# Patient Record
Sex: Female | Born: 1975
Health system: Southern US, Community
[De-identification: ages and names within clinical notes are randomized; demographics above are authoritative.]

## PROBLEM LIST (undated history)

## (undated) DIAGNOSIS — I219 Acute myocardial infarction, unspecified: Secondary | ICD-10-CM

## (undated) DIAGNOSIS — G43909 Migraine, unspecified, not intractable, without status migrainosus: Secondary | ICD-10-CM

## (undated) HISTORY — PX: NO PAST SURGERIES: SHX2092

## (undated) HISTORY — DX: Migraine, unspecified, not intractable, without status migrainosus: G43.909

---

## 2007-11-06 ENCOUNTER — Inpatient Hospital Stay (HOSPITAL_COMMUNITY): Admission: EM | Admit: 2007-11-06 | Discharge: 2007-11-09 | Payer: Self-pay | Admitting: Psychiatry

## 2007-11-06 ENCOUNTER — Ambulatory Visit: Payer: Self-pay | Admitting: Psychiatry

## 2007-11-21 ENCOUNTER — Inpatient Hospital Stay (HOSPITAL_COMMUNITY): Admission: AD | Admit: 2007-11-21 | Discharge: 2007-11-24 | Payer: Self-pay | Admitting: *Deleted

## 2007-11-21 ENCOUNTER — Ambulatory Visit: Payer: Self-pay | Admitting: *Deleted

## 2009-07-20 ENCOUNTER — Emergency Department (HOSPITAL_COMMUNITY): Admission: EM | Admit: 2009-07-20 | Discharge: 2009-07-20 | Payer: Self-pay | Admitting: Emergency Medicine

## 2009-07-21 ENCOUNTER — Ambulatory Visit (HOSPITAL_COMMUNITY): Admission: RE | Admit: 2009-07-21 | Discharge: 2009-07-21 | Payer: Self-pay | Admitting: Emergency Medicine

## 2010-10-07 IMAGING — US US PELVIS COMPLETE MODIFY
1 series · 14 of 25 positions shown · non-contrast
Comparison: None

CLINICAL DATA: Abdominal pain question ovarian cysts

TRANSABDOMINAL AND TRANSVAGINAL ULTRASOUND OF PELVIS
TECHNIQUE: Both transabdominal and transvaginal ultrasound
examinations of the pelvis were performed including evaluation of
the uterus, ovaries, adnexal regions, and pelvic cul-de-sac.

[Series 1: us pelvis complete modify · 0.24mm/px · 14 of 77 slices shown]
[im 1/77]
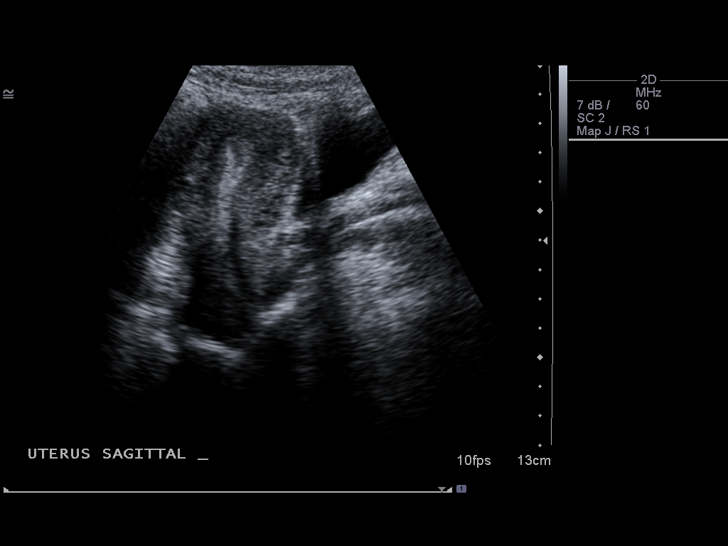
[im 7/77]
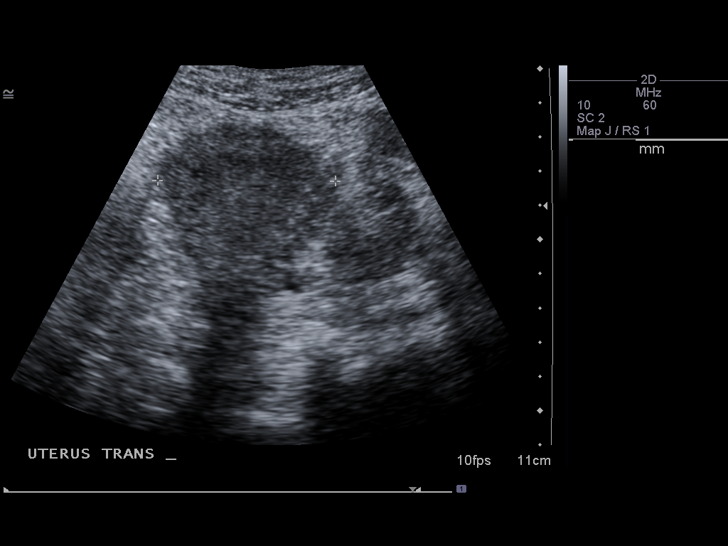
[im 13/77]
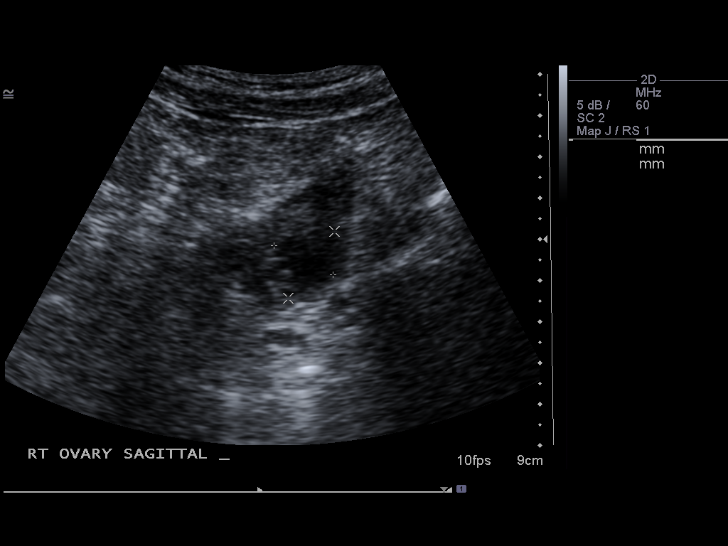
[im 20/77]
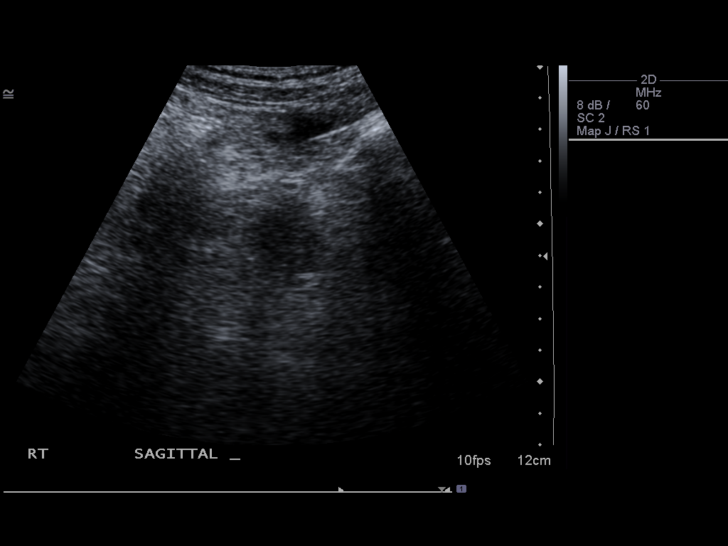
[im 26/77]
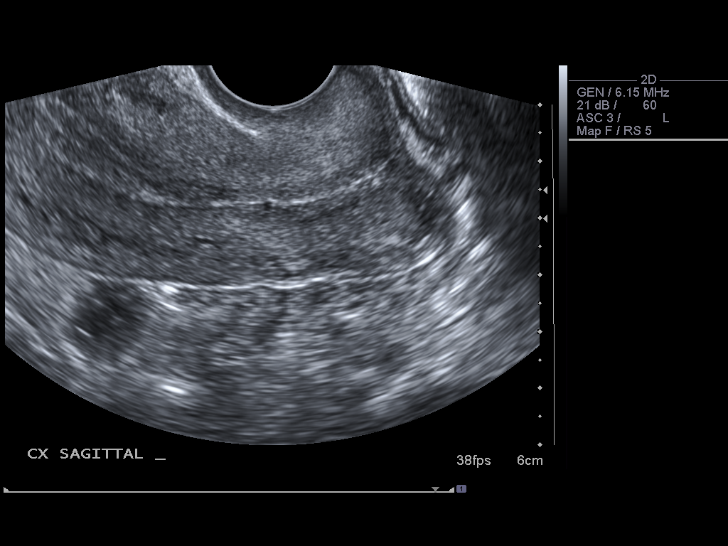
[im 29/77]
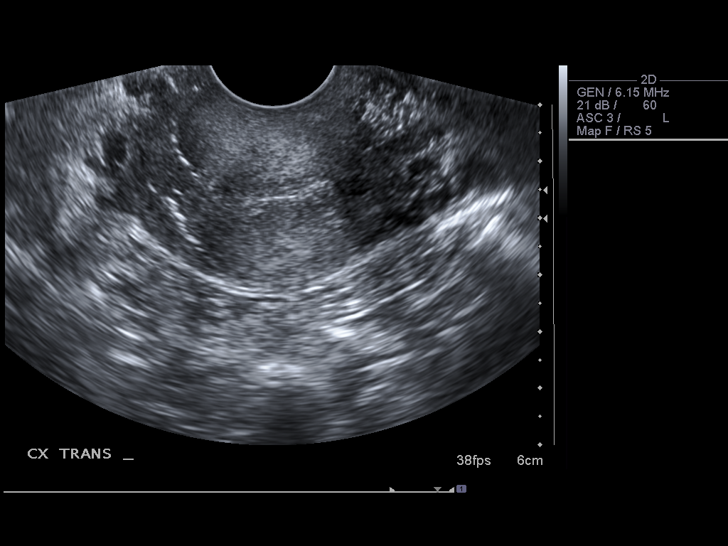
[im 35/77]
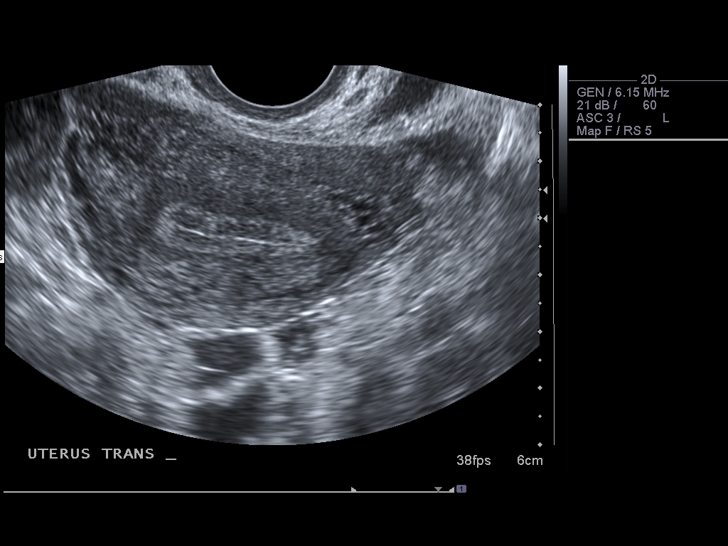
[im 42/77]
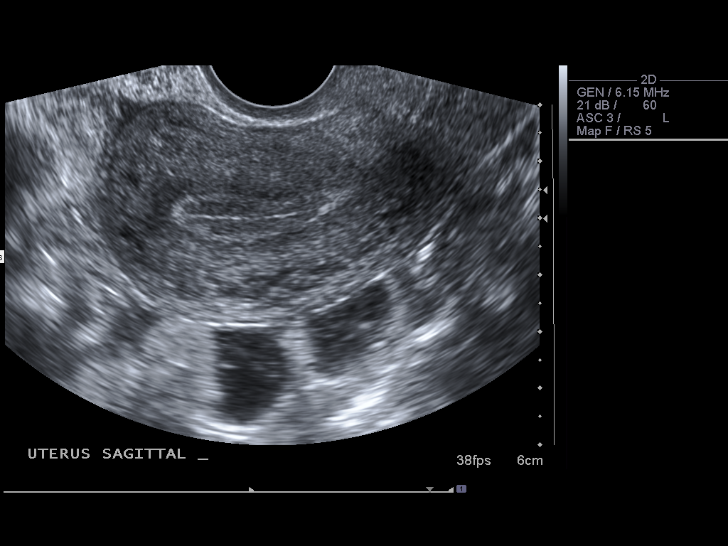
[im 48/77]
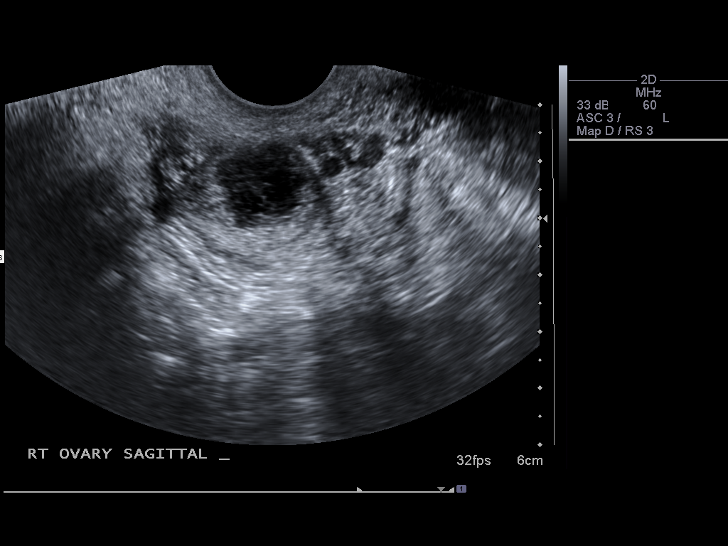
[im 51/77]
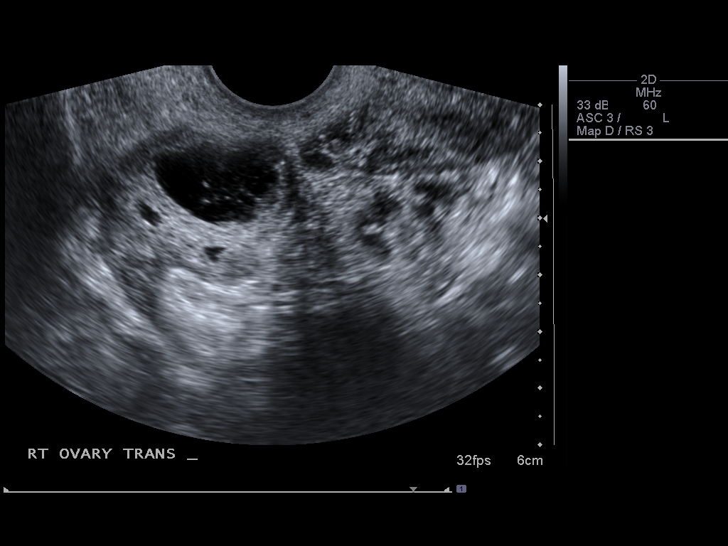
[im 58/77]
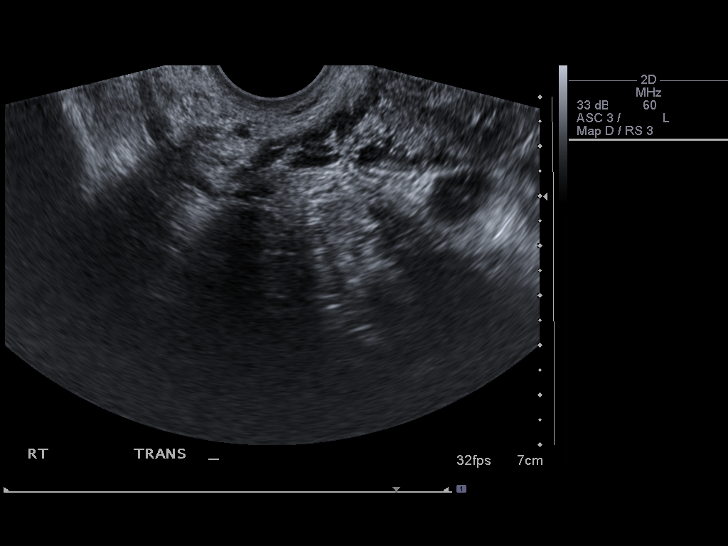
[im 64/77]
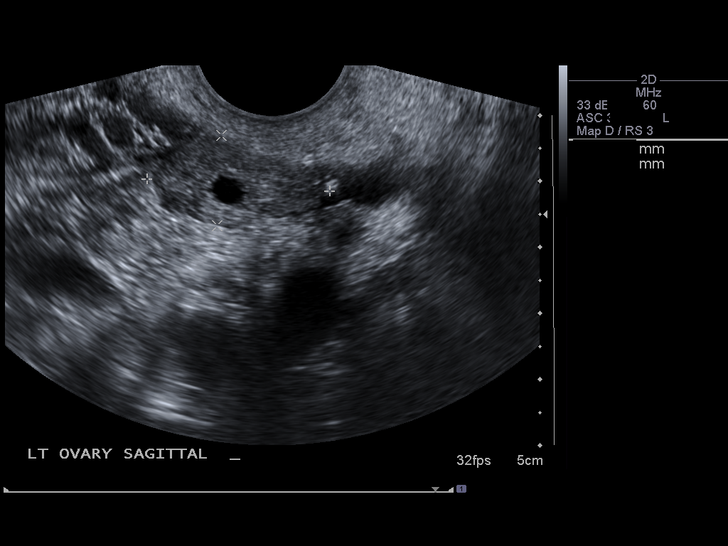
[im 70/77]
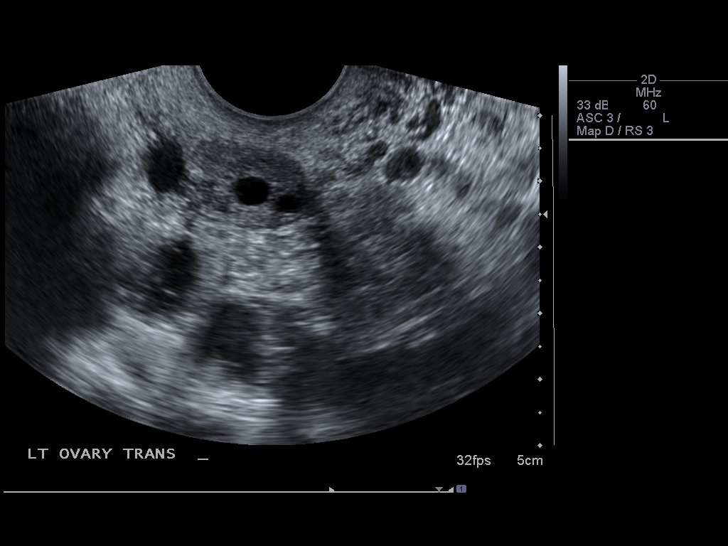
[im 77/77]
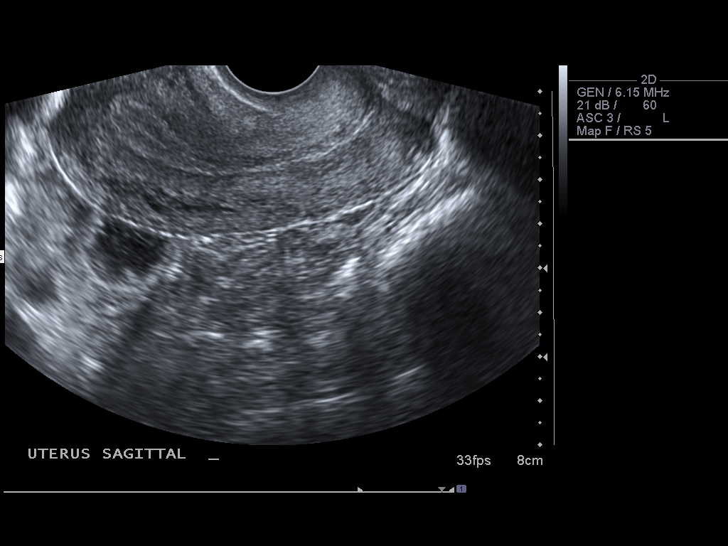

[14 of 25 positions shown; findings below may reference images not displayed]

FINDINGS: Uterus measures 8.7 cm length by 4.1 cm AP by 5.6 cm transverse.
No uterine mass.

Endometrium measures 9 mm in thickness.  No endometrial fluid

Right Ovary measures 3.5 x 3.0 x 3.6 cm.
Complicated cyst, question hemorrhagic, right ovary, 2.0 x 1.9 x
2.5 cm.

Left Ovary measures 2.8 x 1.4 x 2.2 cm.  Normal morphology without
mass.

Other Findings:  No free pelvic fluid or additional adnexal masses.
IMPRESSION: Small complicated question hemorrhagic cyst right ovary 2.5 cm
greatest size, recommend follow-up ultrasound after one to two
menstrual cycles in order to reassess, to exclude cystic ovarian
neoplasm.

## 2011-03-18 LAB — CBC
Hemoglobin: 13.1 g/dL (ref 12.0–15.0)
MCHC: 34.6 g/dL (ref 30.0–36.0)
RBC: 3.93 MIL/uL (ref 3.87–5.11)
WBC: 8.1 10*3/uL (ref 4.0–10.5)

## 2011-03-18 LAB — DIFFERENTIAL
Lymphocytes Relative: 29 % (ref 12–46)
Lymphs Abs: 2.4 10*3/uL (ref 0.7–4.0)
Monocytes Absolute: 0.5 10*3/uL (ref 0.1–1.0)
Monocytes Relative: 6 % (ref 3–12)
Neutro Abs: 5 10*3/uL (ref 1.7–7.7)
Neutrophils Relative %: 62 % (ref 43–77)

## 2011-03-18 LAB — URINALYSIS, ROUTINE W REFLEX MICROSCOPIC
Nitrite: NEGATIVE
Specific Gravity, Urine: >1.046 — ABNORMAL HIGH (ref 1.005–1.030)
Urobilinogen, UA: 1 mg/dL (ref 0.0–1.0)
pH: 5 (ref 5.0–8.0)

## 2011-03-18 LAB — BASIC METABOLIC PANEL
CO2: 27 mEq/L (ref 19–32)
Calcium: 8.6 mg/dL (ref 8.4–10.5)
Creatinine, Ser: 0.85 mg/dL (ref 0.4–1.2)
GFR calc Af Amer: >60 mL/min (ref >60)
GFR calc non Af Amer: >60 mL/min (ref >60)
Sodium: 137 mEq/L (ref 135–145)

## 2011-03-18 LAB — HEPATIC FUNCTION PANEL
ALT: 8 U/L (ref 0–35)
AST: 19 U/L (ref 0–37)
Albumin: 3.7 g/dL (ref 3.5–5.2)
Alkaline Phosphatase: 19 U/L — ABNORMAL LOW (ref 39–117)
Bilirubin, Direct: <0.1 mg/dL (ref 0.0–0.3)
Total Bilirubin: 0.4 mg/dL (ref 0.3–1.2)

## 2011-03-18 LAB — POCT PREGNANCY, URINE: Preg Test, Ur: NEGATIVE
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
## 2011-04-24 NOTE — H&P (Signed)
NAMEMAKALA, FETTEROLF NO.:  [id number]   MEDICAL RECORD NO.:  [id number]          PATIENT TYPE:  IPS   LOCATION:  0605                          FACILITY:  [facility]   PHYSICIAN:  Unknown                DATE OF BIRTH:  1976-06-11   DATE OF ADMISSION:  11/21/2007  DATE OF DISCHARGE:                       PSYCHIATRIC ADMISSION ASSESSMENT   Report totally inaudible and cannot be transcribed.      Vic Ripper, P.A.-C.    ______________________________  Unknown    MD/MEDQ  D:  11/22/2007  T:  11/24/2007  Job:  [id number]

## 2011-04-24 NOTE — Discharge Summary (Signed)
NAMEJAMYIA, FORTUNE NO.:  [id number]   MEDICAL RECORD NO.:  [id number]          PATIENT TYPE:  IPS   LOCATION:  0605                          FACILITY:  [facility]   PHYSICIAN:  Jasmine Pang, M.D. DATE OF BIRTH:  04-10-1976   DATE OF ADMISSION:  11/21/2007  DATE OF DISCHARGE:  11/24/2007                               DISCHARGE SUMMARY   IDENTIFICATION:  The patient is a 35 year old Caucasian female who  overdosed on Seroquel.   HISTORY OF PRESENT ILLNESS:  The patient was at [facility] less than 2  weeks ago.  She has significant stress and depression.  She states that  she believes she is taking too much of Seroquel, which made her pass  out.  She cannot remember taking the overdose.  She denied it was  intentional where the ED doctor felt it was an intentional overdose.  She was lethargic when she got to the ED.  She recently had conflicts  with her fiance and she also is unemployed and just began Mining engineer program.  The patient was at [facility] on November 06, 2007 through November 09, 2007.  She did go to [facility] for followup.  She has a family history of alcohol dependence in  mother and maternal side, have a history of MIs.  She drinks alcohol; a  fifth of vodka on November 19, 2007.  She denies other drug use.  She  has no primary care physician.  She has no medical problems.  There is  no acute or chronic medical problems.  She states she was on Seroquel  200 mg p.o. q.h.s., but does not want to stay on this anymore.  She has  no known drug allergies.   PHYSICAL FINDINGS:  A complete physical exam was done at [facility] while the patient was admitted there after the overdose.   ADMISSION LABORATORIES:  Evaluated by the ED physician.   HOSPITAL COURSE:  Upon admission, the patient was continued on Seroquel  200 mg p.o. q.h.s.  She refused to take this however, and stated she did  not feel the Seroquel had  been helping her.  She stated she did not want  to be on any medication.  She was friendly and appropriate in individual  sessions.  She also was able to participate appropriately in unit  therapeutic groups and activities.  The patient tended to minimize her  problems prior to admission.  Her UDS was positive for benzodiazepine.  She stated her boyfriend called 9-1-1 after she passed out from taking  Seroquel.  She also admitted to drinking.  She stated she was going to  go home and live with her mother for a while.  Again, she refused  medications while here.  On November 24, 2007, mental status had  improved from admission status.  The patient was friendly and  cooperative with good eye contact.  Speech was normal rate and flow.  Psychomotor activity was within normal limits.  Mood was euthymic.  Affect, wide range.  There  was no suicidal or homicidal ideation.  No  thoughts of self-injurious behavior.  No auditory or visual  hallucinations.  No paranoia or delusions.  Thoughts were logical and  goal-directed.  Thought content, no predominant theme.  Cognitive was  grossly back to baseline.  It was felt that the patient was safe to be  discharged today.  She stated she was going to go home to live with her  mother for a while rather than trying to return to her fiance.   DISCHARGE DIAGNOSES:  AXIS I:  Mood disorder, not otherwise specified.  Alcohol dependence.  AXIS II:  Personality disorder, not otherwise specified.  AXIS III:  None.  AXIS IV:  Severe (problems with primary support group, occupational  problem, housing problem, economic problem, and burden of psychiatric  illness.  AXIS V:  Global assessment of functioning upon discharge was 50.  Global  assessment of functioning upon admission was 25.  Global assessment of  functioning highest past year was 60-65.   DISCHARGE PLAN:  There were no specific activity level or dietary  restrictions.   POSTHOSPITAL CARE PLAN:  The  patient will go to [facility] on November 25, 2007, at 9 o'clock a.m.  She is followed by  therapeutic alternatives at the clinic and will continue to be seen by  them.   DISCHARGE MEDICATIONS:  The patient has stated that she did not want  medications at this point.  She did not feel Seroquel had been helpful  for her; in fact felt it had worsened her condition.      Jasmine Pang, M.D.  Electronically Signed     BHS/MEDQ  D:  11/24/2007  T:  11/25/2007  Job:  [id number]

## 2011-11-13 ENCOUNTER — Emergency Department (HOSPITAL_COMMUNITY)
Admission: EM | Admit: 2011-11-13 | Discharge: 2011-11-13 | Disposition: A | Discharge status: Home / Self Care | Payer: Self-pay | Attending: Emergency Medicine | Admitting: Emergency Medicine

## 2011-11-13 DIAGNOSIS — R109 Unspecified abdominal pain: Secondary | ICD-10-CM | POA: Insufficient documentation

## 2011-11-13 DIAGNOSIS — R10816 Epigastric abdominal tenderness: Secondary | ICD-10-CM | POA: Insufficient documentation

## 2011-11-13 DIAGNOSIS — K259 Gastric ulcer, unspecified as acute or chronic, without hemorrhage or perforation: Secondary | ICD-10-CM | POA: Insufficient documentation

## 2011-11-13 HISTORY — DX: Acute myocardial infarction, unspecified: I21.9

## 2011-11-13 LAB — CBC
MCH: 32.9 pg (ref 26.0–34.0)
MCHC: 34.6 g/dL (ref 30.0–36.0)
MCV: 95.1 fL (ref 78.0–100.0)
Platelets: 198 10*3/uL (ref 150–400)
RBC: 4.07 MIL/uL (ref 3.87–5.11)

## 2011-11-13 LAB — COMPREHENSIVE METABOLIC PANEL
AST: 11 U/L (ref 0–37)
Albumin: 3.5 g/dL (ref 3.5–5.2)
BUN: 10 mg/dL (ref 6–23)
CO2: 24 mEq/L (ref 19–32)
Calcium: 8.9 mg/dL (ref 8.4–10.5)
Chloride: 106 mEq/L (ref 96–112)
Creatinine, Ser: 0.67 mg/dL (ref 0.50–1.10)
GFR calc non Af Amer: >90 mL/min (ref >90)
Total Bilirubin: 0.3 mg/dL (ref 0.3–1.2)

## 2011-11-13 LAB — URINALYSIS, ROUTINE W REFLEX MICROSCOPIC
Leukocytes, UA: NEGATIVE
Protein, ur: NEGATIVE mg/dL
Specific Gravity, Urine: 1.01 (ref 1.005–1.030)
Urobilinogen, UA: 0.2 mg/dL (ref 0.0–1.0)

## 2011-11-13 LAB — LIPASE, BLOOD: Lipase: 23 U/L (ref 11–59)

## 2011-11-13 LAB — URINE MICROSCOPIC-ADD ON

## 2011-11-13 LAB — PREGNANCY, URINE: Preg Test, Ur: NEGATIVE

## 2011-11-13 MED ORDER — PANTOPRAZOLE SODIUM 40 MG IV SOLR
40.0000 mg | Freq: Once | INTRAVENOUS | Status: AC
  Administered 2011-11-13: 40 mg via INTRAVENOUS
  Filled 2011-11-13: qty 40

## 2011-11-13 MED ORDER — SODIUM CHLORIDE 0.9 % IV BOLUS (SEPSIS)
1000.0000 mL | Freq: Once | INTRAVENOUS | Status: AC
  Administered 2011-11-13: 1000 mL via INTRAVENOUS

## 2011-11-13 MED ORDER — ONDANSETRON HCL 4 MG PO TABS: 4.0000 mg | ORAL_TABLET | Freq: Four times a day (QID) | ORAL | Status: AC

## 2011-11-13 MED ORDER — GI COCKTAIL ~~LOC~~
30.0000 mL | Freq: Once | ORAL | Status: AC
  Administered 2011-11-13: 30 mL via ORAL
  Filled 2011-11-13: qty 30

## 2011-11-13 MED ORDER — OXYCODONE-ACETAMINOPHEN 5-325 MG PO TABS: 1.0000 | ORAL_TABLET | Freq: Four times a day (QID) | ORAL | Status: AC | PRN

## 2011-11-13 MED ORDER — MORPHINE SULFATE 2 MG/ML IJ SOLN
INTRAMUSCULAR | Status: AC
  Administered 2011-11-13: 4 mg via INTRAVENOUS
  Filled 2011-11-13: qty 2

## 2011-11-13 MED ORDER — OMEPRAZOLE 20 MG PO CPDR: 20.0000 mg | DELAYED_RELEASE_CAPSULE | Freq: Every day | ORAL | Status: DC

## 2011-11-13 MED ORDER — MORPHINE SULFATE 4 MG/ML IJ SOLN: 4.0000 mg | Freq: Once | INTRAMUSCULAR | Status: DC

## 2011-11-13 NOTE — ED Provider Notes (Signed)
History     CSN: [id number] Arrival date & time: 11/13/2011  9:05 AM   First MD Initiated Contact with Patient 11/13/11 1007      Chief Complaint  Patient presents with  . Abdominal Pain  . Nausea  . Emesis    (Consider location/radiation/quality/duration/timing/severity/associated sxs/prior treatment) The history is provided by the patient.    Pt presents to the ED with complaints of midepigastric pain for 3 weeks. She was seen at the [facility]  ED and was told that she has bronchitis and given prednisone and some other medications but she says that it made it worse. She says that they did take blood and do a chest xray. The patient states that the pain gets much worse after eating and has kept her from eating as much as normal. She says that she has lost weight from this. She denies vomiting, diarrhea, chest pains and denies a pervious hx of these symptoms, heart burn, or ulcers in the past  Past Medical History  Diagnosis Date  . Heart attack     11 years ago    History reviewed. No pertinent past surgical history.  No family history on file.  History  Substance Use Topics  . Smoking status: Current Everyday Smoker -- 0.5 packs/day    Types: Cigarettes  . Smokeless tobacco: Not on file  . Alcohol Use: No    OB History    Grav Para Term Preterm Abortions TAB SAB Ect Mult Living                  Review of Systems  All other systems reviewed and are negative.    Allergies  Review of patient's allergies indicates no known allergies.  Home Medications   Current Outpatient Rx  Name Route Sig Dispense Refill  . AZITHROMYCIN 250 MG PO TABS Oral Take 250 mg by mouth daily. zpak completed therapy on 11/12/11     . HYDROCODONE-HOMATROPINE 5-1.5 MG/5ML PO SYRP Oral Take 5 mLs by mouth every 6 (six) hours as needed. cough     . IBUPROFEN 200 MG PO TABS Oral Take 200 mg by mouth every 6 (six) hours as needed. pain     . METHYLPREDNISOLONE 4 MG PO KIT Oral Take 4 mg by  mouth daily. follow package directions-completed therapy on12/3/12       BP 117/71  Pulse 65  Temp(Src) 97.8 F (36.6 C) (Oral)  Resp 18  Ht 5\' 10"  (1.778 m)  Wt 120 lb (54.432 kg)  BMI 17.22 kg/m2  SpO2 100%  LMP 11/12/2011  Physical Exam  Nursing note and vitals reviewed. Constitutional: She appears well-developed and well-nourished.  HENT:  Head: Normocephalic and atraumatic.  Eyes: Conjunctivae are normal. Pupils are equal, round, and reactive to light.  Neck: Trachea normal, normal range of motion and full passive range of motion without pain. Neck supple.  Cardiovascular: Normal rate, regular rhythm and normal pulses.   Pulmonary/Chest: Effort normal and breath sounds normal. Chest wall is not dull to percussion. She exhibits no tenderness, no crepitus, no edema, no deformity and no retraction.  Abdominal: Soft. Normal appearance and bowel sounds are normal. She exhibits no distension and no mass. There is tenderness (mid epigastric pain is mod/severe on plapation). There is no rebound and no guarding.  Musculoskeletal: Normal range of motion.  Neurological: She is alert. She has normal strength.  Skin: Skin is warm, dry and intact.  Psychiatric: She has a normal mood and affect. Her speech is  normal and behavior is normal. Judgment and thought content normal. Cognition and memory are normal.    ED Course  Procedures (including critical care time)   Labs Reviewed  CBC  LIPASE, BLOOD  COMPREHENSIVE METABOLIC PANEL  URINALYSIS, ROUTINE W REFLEX MICROSCOPIC  PREGNANCY, URINE   No results found.   No diagnosis found.    MDM  Clinically the patient appears to have a peptic/ gastric ulcer- pain worse with eating, mid epigastric pain reproducible with palpation, slight improvement with GI cocktail, pain has been going on for 3 weeks. Will treat for ulcer with PPI and refer to GI doctor.         Dorthula Matas, PA 11/13/11 1236

## 2011-11-13 NOTE — ED Provider Notes (Signed)
Medical screening examination/treatment/procedure(s) were performed by non-physician practitioner and as supervising physician I was immediately available for consultation/collaboration.    Nelia Shi, MD 11/13/11 [phone]

## 2011-11-13 NOTE — ED Notes (Signed)
Pt presents with NAD- seen at local hospital for abd pain but was dx with  bronchitis. rx  given.  Reason for coming to hospital was for abdominal pain xray taken only. Pt reports weight loss and continued symptoms for 3 weeks

## 2018-02-06 DIAGNOSIS — Z1331 Encounter for screening for depression: Secondary | ICD-10-CM | POA: Diagnosis not present

## 2018-02-06 DIAGNOSIS — G43909 Migraine, unspecified, not intractable, without status migrainosus: Secondary | ICD-10-CM | POA: Diagnosis not present

## 2018-02-06 DIAGNOSIS — Z1389 Encounter for screening for other disorder: Secondary | ICD-10-CM | POA: Diagnosis not present

## 2018-02-06 DIAGNOSIS — R5382 Chronic fatigue, unspecified: Secondary | ICD-10-CM | POA: Diagnosis not present

## 2018-02-06 DIAGNOSIS — K219 Gastro-esophageal reflux disease without esophagitis: Secondary | ICD-10-CM | POA: Diagnosis not present

## 2018-02-06 DIAGNOSIS — Z681 Body mass index (BMI) 19 or less, adult: Secondary | ICD-10-CM | POA: Diagnosis not present

## 2018-02-06 DIAGNOSIS — H8110 Benign paroxysmal vertigo, unspecified ear: Secondary | ICD-10-CM | POA: Diagnosis not present

## 2018-02-17 DIAGNOSIS — R350 Frequency of micturition: Secondary | ICD-10-CM | POA: Diagnosis not present

## 2018-02-17 DIAGNOSIS — N3010 Interstitial cystitis (chronic) without hematuria: Secondary | ICD-10-CM | POA: Diagnosis not present

## 2018-02-19 DIAGNOSIS — N3010 Interstitial cystitis (chronic) without hematuria: Secondary | ICD-10-CM | POA: Diagnosis not present

## 2018-02-19 DIAGNOSIS — Q048 Other specified congenital malformations of brain: Secondary | ICD-10-CM | POA: Diagnosis not present

## 2018-02-19 DIAGNOSIS — K219 Gastro-esophageal reflux disease without esophagitis: Secondary | ICD-10-CM | POA: Diagnosis not present

## 2018-02-19 DIAGNOSIS — G43109 Migraine with aura, not intractable, without status migrainosus: Secondary | ICD-10-CM | POA: Diagnosis not present

## 2018-02-19 DIAGNOSIS — Z681 Body mass index (BMI) 19 or less, adult: Secondary | ICD-10-CM | POA: Diagnosis not present

## 2018-03-04 DIAGNOSIS — N3010 Interstitial cystitis (chronic) without hematuria: Secondary | ICD-10-CM | POA: Diagnosis not present

## 2018-03-04 DIAGNOSIS — R358 Other polyuria: Secondary | ICD-10-CM | POA: Diagnosis not present

## 2018-03-05 DIAGNOSIS — N3010 Interstitial cystitis (chronic) without hematuria: Secondary | ICD-10-CM | POA: Diagnosis not present

## 2018-03-05 DIAGNOSIS — Z87891 Personal history of nicotine dependence: Secondary | ICD-10-CM | POA: Diagnosis not present

## 2018-03-05 DIAGNOSIS — Z681 Body mass index (BMI) 19 or less, adult: Secondary | ICD-10-CM | POA: Diagnosis not present

## 2018-03-05 DIAGNOSIS — K219 Gastro-esophageal reflux disease without esophagitis: Secondary | ICD-10-CM | POA: Diagnosis not present

## 2018-03-05 DIAGNOSIS — Q048 Other specified congenital malformations of brain: Secondary | ICD-10-CM | POA: Diagnosis not present

## 2018-03-05 DIAGNOSIS — F17200 Nicotine dependence, unspecified, uncomplicated: Secondary | ICD-10-CM | POA: Diagnosis not present

## 2018-03-05 DIAGNOSIS — G43909 Migraine, unspecified, not intractable, without status migrainosus: Secondary | ICD-10-CM | POA: Diagnosis not present

## 2018-03-18 DIAGNOSIS — H52209 Unspecified astigmatism, unspecified eye: Secondary | ICD-10-CM | POA: Diagnosis not present

## 2018-03-18 DIAGNOSIS — H52229 Regular astigmatism, unspecified eye: Secondary | ICD-10-CM | POA: Diagnosis not present

## 2018-03-18 DIAGNOSIS — H5213 Myopia, bilateral: Secondary | ICD-10-CM | POA: Diagnosis not present

## 2018-03-20 DIAGNOSIS — H40003 Preglaucoma, unspecified, bilateral: Secondary | ICD-10-CM | POA: Diagnosis not present

## 2018-04-04 DIAGNOSIS — Z681 Body mass index (BMI) 19 or less, adult: Secondary | ICD-10-CM | POA: Diagnosis not present

## 2018-04-04 DIAGNOSIS — R636 Underweight: Secondary | ICD-10-CM | POA: Diagnosis not present

## 2018-04-04 DIAGNOSIS — G43909 Migraine, unspecified, not intractable, without status migrainosus: Secondary | ICD-10-CM | POA: Diagnosis not present

## 2018-04-04 DIAGNOSIS — Q048 Other specified congenital malformations of brain: Secondary | ICD-10-CM | POA: Diagnosis not present

## 2018-04-04 DIAGNOSIS — F17200 Nicotine dependence, unspecified, uncomplicated: Secondary | ICD-10-CM | POA: Diagnosis not present

## 2018-04-04 DIAGNOSIS — K219 Gastro-esophageal reflux disease without esophagitis: Secondary | ICD-10-CM | POA: Diagnosis not present

## 2018-04-04 DIAGNOSIS — N3010 Interstitial cystitis (chronic) without hematuria: Secondary | ICD-10-CM | POA: Diagnosis not present

## 2018-04-10 ENCOUNTER — Encounter: Payer: Self-pay | Admitting: Neurology

## 2018-04-10 ENCOUNTER — Ambulatory Visit (INDEPENDENT_AMBULATORY_CARE_PROVIDER_SITE_OTHER): Payer: Medicare HMO | Admitting: Neurology

## 2018-04-10 VITALS — BP 108/70 | HR 58 | Ht 71.0 in | Wt 125.2 lb

## 2018-04-10 DIAGNOSIS — R52 Pain, unspecified: Secondary | ICD-10-CM | POA: Diagnosis not present

## 2018-04-10 DIAGNOSIS — R51 Headache: Secondary | ICD-10-CM | POA: Diagnosis not present

## 2018-04-10 DIAGNOSIS — R799 Abnormal finding of blood chemistry, unspecified: Secondary | ICD-10-CM | POA: Diagnosis not present

## 2018-04-10 DIAGNOSIS — G43711 Chronic migraine without aura, intractable, with status migrainosus: Secondary | ICD-10-CM | POA: Diagnosis not present

## 2018-04-10 DIAGNOSIS — G8929 Other chronic pain: Secondary | ICD-10-CM

## 2018-04-10 NOTE — Progress Notes (Signed)
[facility]    Provider:  Dr Lucia Gaskins Referring Provider: Simone Curia, MD Primary Care Physician:  Simone Curia, MD  CC:  Chronic Migraines  HPI:  Brittany Hopkins is a 42 y.o. female here as a referral from Dr. Nedra Hai for migraines.  Past medical history benign positional vertigo, vestibular migraines,  cerebellar tonsillar ectopia, migraines, interstitial cystitis, vertigo. Started 19 years ago after having babies. She has been to multiple doctors and tried multiple medications. Worsened over the last several years. She has daily headaches: can be unilateral, can be pounding, throbbing, with light and sound sensitivity, nausea and vomiting. Movement makes it worse. She also feels stabbing and hot in the vertex of her head. Migraines can last 4-72 hours and be severe. No medication overuse. 30 are migrainous out of 30 days for over a year. She has a continuous headache. No aura. No medication overuse. She has associated dizziness and vertigo. Also tinnitus. No other focal neurologic deficits, associated symptoms, inciting events or modifiable factors.  Medications tried over 3 months each: Topiramate, Amitriptyline, meclizine, zofran, zonisamide, maxalt, sumatriptan, mirtazapine, flexeril  Reviewed notes, labs and imaging from outside physicians, which showed:  Reviewed referring physicians notes.  Patient has migraine headaches.  Onset was gradual a year ago.  The patient describes it as moderate in severity and unchanged.  Symptoms include dizziness, weakness, syncope and positional vertigo.  The dizziness is described as lightheadedness, vertigo, and states that his sensation of movement, no inciting events, dizziness occurs daily, associated symptoms include nausea.  Also reports fatigue.  Reviewed exam which was normal.  Patient had an MRI of the head in June 2015, echocardiogram December 2014 and CT scan of the abdomen March 2015.  Labs include unremarkable CBC, unremarkable CMP  with BUN 8 and creatinine 0.88, TSH normal 1.2 drawn February 07, 2018.  MRI brain in 2015, will try and retrieve records but per neurology notes The MRI was significant for bilateral mastoid effusions. This was reviewed with her. There was some cerebellar ectopia but no definite Chiari malformation.    MRI of Brain (06/02/14) - Cerebellar ectopia w/out definite Chiari Malformation otherwise normal MRI appearance of the brain. Bilateral mastoid effusions. No obstructing nasopharyngeal lesion is evident   Review of Systems: Patient complains of symptoms per HPI as well as the following symptoms: headache, dizziness, not enough sleep, spinning sensation . Pertinent negatives and positives per HPI. All others negative.   Social History   Socioeconomic History  . Marital status: Single    Spouse name: Not on file  . Number of children: Not on file  . Years of education: Not on file  . Highest education level: Not on file  Occupational History  . Not on file  Social Needs  . Financial resource strain: Not on file  . Food insecurity:    Worry: Not on file    Inability: Not on file  . Transportation needs:    Medical: Not on file    Non-medical: Not on file  Tobacco Use  . Smoking status: Current Every Day Smoker    Packs/day: 0.50    Types: Cigarettes  . Smokeless tobacco: Never Used  Substance and Sexual Activity  . Alcohol use: No  . Drug use: No  . Sexual activity: Not on file  Lifestyle  . Physical activity:    Days per week: Not on file    Minutes per session: Not on file  . Stress: Not on file  Relationships  .  Social connections:    Talks on phone: Not on file    Gets together: Not on file    Attends religious service: Not on file    Active member of club or organization: Not on file    Attends meetings of clubs or organizations: Not on file    Relationship status: Not on file  . Intimate partner violence:    Fear of current or ex partner: Not on file    Emotionally  abused: Not on file    Physically abused: Not on file    Forced sexual activity: Not on file  Other Topics Concern  . Not on file  Social History Narrative  . Not on file    Family History  Problem Relation Age of Onset  . Migraines Neg Hx     Past Medical History:  Diagnosis Date  . Heart attack (HCC)    11 years ago    Past Surgical History:  Procedure Laterality Date  . NO PAST SURGERIES      Current Outpatient Medications  Medication Sig Dispense Refill  . Aspirin-Acetaminophen-Caffeine (GOODYS EXTRA STRENGTH PO) Take by mouth.    Marland Kitchen ibuprofen (ADVIL,MOTRIN) 200 MG tablet Take 200 mg by mouth every 6 (six) hours as needed. pain     . meclizine (ANTIVERT) 12.5 MG tablet Take 12.5 mg by mouth 3 (three) times daily as needed for dizziness.     No current facility-administered medications for this visit.     Allergies as of 04/10/2018  . (No Known Allergies)    Vitals: BP 108/70   Pulse (!) 58   Ht  (1.803 m)   Wt 125 lb 3.2 oz (56.8 kg)   BMI 17.46 kg/m  Last Weight:  Wt Readings from Last 1 Encounters:  04/10/18 125 lb 3.2 oz (56.8 kg)   Last Height:   Ht Readings from Last 1 Encounters:  04/10/18  (1.803 m)    Physical exam: Exam: Gen: NAD                     CV: RRR, no MRG. No Carotid Bruits. No peripheral edema, warm, nontender Eyes: Conjunctivae clear without exudates or hemorrhage  Neuro: Detailed Neurologic Exam  Speech:    Speech is normal; fluent and spontaneous with normal comprehension.  Cognition:    The patient is oriented to person, place, and time;     recent and remote memory intact;     language fluent;     normal attention, concentration,     fund of knowledge Cranial Nerves:    The pupils are equal, round, and reactive to light. The fundi are normal and spontaneous venous pulsations are present. Visual fields are full to finger confrontation. Extraocular movements are intact. Trigeminal sensation is intact and the  muscles of mastication are normal. The face is symmetric. The palate elevates in the midline. Hearing intact. Voice is normal. Shoulder shrug is normal. The tongue has normal motion without fasciculations.   Coordination:    Normal finger to nose and heel to shin. Normal rapid alternating movements.   Gait:    Heel-toe and tandem gait are normal.   Motor Observation:    No asymmetry, no atrophy, and no involuntary movements noted. Tone:    Normal muscle tone.    Posture:    Posture is normal. normal erect    Strength:    Strength is V/V in the upper and lower limbs.  Sensation: intact to LT     Reflex Exam:  DTR's:    Deep tendon reflexes in the upper and lower extremities are symmetrical bilaterally.   Toes:    The toes are downgoing bilaterally.   Clonus:    Clonus is absent.      Assessment/Plan:  42 year old female with intractable migraines has failed multiple classes of medication  - Recommend Botox - Unremarkable MRI in 2015, no change in frequency, quality or severity, no indication for MRI at this time, consider in the future if migraines do not improve with botox. Can't have mri due to stimulator placement.  Discussed: To prevent or relieve headaches, try the following: Cool Compress. Lie down and place a cool compress on your head.  Avoid headache triggers. If certain foods or odors seem to have triggered your migraines in the past, avoid them. A headache diary might help you identify triggers.  Include physical activity in your daily routine. Try a daily walk or other moderate aerobic exercise.  Manage stress. Find healthy ways to cope with the stressors, such as delegating tasks on your to-do list.  Practice relaxation techniques. Try deep breathing, yoga, massage and visualization.  Eat regularly. Eating regularly scheduled meals and maintaining a healthy diet might help prevent headaches. Also, drink plenty of fluids.  Follow a regular sleep schedule.  Sleep deprivation might contribute to headaches Consider biofeedback. With this mind-body technique, you learn to control certain bodily functions - such as muscle tension, heart rate and blood pressure - to prevent headaches or reduce headache pain.    Proceed to emergency room if you experience new or worsening symptoms or symptoms do not resolve, if you have new neurologic symptoms or if headache is severe, or for any concerning symptom.   Provided education and documentation from American headache Society toolbox including articles on: chronic migraine medication overuse headache, chronic migraines, prevention of migraines, behavioral and other nonpharmacologic treatments for headache.  Orders Placed This Encounter  Procedures  . CBC  . Comprehensive metabolic panel  . TSH    Cc: Simone Curia, MD  Naomie Dean, MD  [facility] [address] [place], [place] [address]  Phone [phone] Fax [phone]

## 2018-04-10 NOTE — Patient Instructions (Signed)
Start Botox   Migraine Headache A migraine headache is an intense, throbbing pain on one side or both sides of the head. Migraines may also cause other symptoms, such as nausea, vomiting, and sensitivity to light and noise. What are the causes? Doing or taking certain things may also trigger migraines, such as:  Alcohol.  Smoking.  Medicines, such as: ? Medicine used to treat chest pain (nitroglycerine). ? Birth control pills. ? Estrogen pills. ? Certain blood pressure medicines.  Aged cheeses, chocolate, or caffeine.  Foods or drinks that contain nitrates, glutamate, aspartame, or tyramine.  Physical activity.  Other things that may trigger a migraine include:  Menstruation.  Pregnancy.  Hunger.  Stress, lack of sleep, too much sleep, or fatigue.  Weather changes.  What increases the risk? The following factors may make you more likely to experience migraine headaches:  Age. Risk increases with age.  Family history of migraine headaches.  Being Caucasian.  Depression and anxiety.  Obesity.  Being a woman.  Having a hole in the heart (patent foramen ovale) or other heart problems.  What are the signs or symptoms? The main symptom of this condition is pulsating or throbbing pain. Pain may:  Happen in any area of the head, such as on one side or both sides.  Interfere with daily activities.  Get worse with physical activity.  Get worse with exposure to bright lights or loud noises.  Other symptoms may include:  Nausea.  Vomiting.  Dizziness.  General sensitivity to bright lights, loud noises, or smells.  Before you get a migraine, you may get warning signs that a migraine is developing (aura). An aura may include:  Seeing flashing lights or having blind spots.  Seeing bright spots, halos, or zigzag lines.  Having tunnel vision or blurred vision.  Having numbness or a tingling feeling.  Having trouble talking.  Having muscle  weakness.  How is this diagnosed? A migraine headache can be diagnosed based on:  Your symptoms.  A physical exam.  Tests, such as CT scan or MRI of the head. These imaging tests can help rule out other causes of headaches.  Taking fluid from the spine (lumbar puncture) and analyzing it (cerebrospinal fluid analysis, or CSF analysis).  How is this treated? A migraine headache is usually treated with medicines that:  Relieve pain.  Relieve nausea.  Prevent migraines from coming back.  Treatment may also include:  Acupuncture.  Lifestyle changes like avoiding foods that trigger migraines.  Follow these instructions at home: Medicines  Take over-the-counter and prescription medicines only as told by your health care provider.  Do not drive or use heavy machinery while taking prescription pain medicine.  To prevent or treat constipation while you are taking prescription pain medicine, your health care provider may recommend that you: ? Drink enough fluid to keep your urine clear or pale yellow. ? Take over-the-counter or prescription medicines. ? Eat foods that are high in fiber, such as fresh fruits and vegetables, whole grains, and beans. ? Limit foods that are high in fat and processed sugars, such as fried and sweet foods. Lifestyle  Avoid alcohol use.  Do not use any products that contain nicotine or tobacco, such as cigarettes and e-cigarettes. If you need help quitting, ask your health care provider.  Get at least 8 hours of sleep every night.  Limit your stress. General instructions   Keep a journal to find out what may trigger your migraine headaches. For example, write down: ?  What you eat and drink. ? How much sleep you get. ? Any change to your diet or medicines.  If you have a migraine: ? Avoid things that make your symptoms worse, such as bright lights. ? It may help to lie down in a dark, quiet room. ? Do not drive or use heavy machinery. ? Ask  your health care provider what activities are safe for you while you are experiencing symptoms.  Keep all follow-up visits as told by your health care provider. This is important. Contact a health care provider if:  You develop symptoms that are different or more severe than your usual migraine symptoms. Get help right away if:  Your migraine becomes severe.  You have a fever.  You have a stiff neck.  You have vision loss.  Your muscles feel weak or like you cannot control them.  You start to lose your balance often.  You develop trouble walking.  You faint. This information is not intended to replace advice given to you by your health care provider. Make sure you discuss any questions you have with your health care provider. Document Released: 11/26/2005 Document Revised: 06/15/2016 Document Reviewed: 05/14/2016 Elsevier Interactive Patient Education  2017 Elsevier Inc.  

## 2018-04-11 LAB — COMPREHENSIVE METABOLIC PANEL
ALBUMIN: 4.5 g/dL (ref 3.5–5.5)
ALK PHOS: 30 IU/L — AB (ref 39–117)
ALT: 8 IU/L (ref 0–32)
AST: 13 IU/L (ref 0–40)
Albumin/Globulin Ratio: 1.8 (ref 1.2–2.2)
BILIRUBIN TOTAL: 0.3 mg/dL (ref 0.0–1.2)
BUN / CREAT RATIO: 11 (ref 9–23)
BUN: 10 mg/dL (ref 6–24)
CHLORIDE: 104 mmol/L (ref 96–106)
CO2: 22 mmol/L (ref 20–29)
Calcium: 9.7 mg/dL (ref 8.7–10.2)
Creatinine, Ser: 0.87 mg/dL (ref 0.57–1.00)
GFR calc non Af Amer: 84 mL/min/{1.73_m2} (ref >59)
GFR, EST AFRICAN AMERICAN: 96 mL/min/{1.73_m2} (ref >59)
GLUCOSE: 90 mg/dL (ref 65–99)
Globulin, Total: 2.5 g/dL (ref 1.5–4.5)
Potassium: 5 mmol/L (ref 3.5–5.2)
SODIUM: 140 mmol/L (ref 134–144)
TOTAL PROTEIN: 7 g/dL (ref 6.0–8.5)

## 2018-04-11 LAB — CBC
HEMATOCRIT: 42.1 % (ref 34.0–46.6)
Hemoglobin: 14.3 g/dL (ref 11.1–15.9)
MCH: 32.1 pg (ref 26.6–33.0)
MCHC: 34 g/dL (ref 31.5–35.7)
MCV: 94 fL (ref 79–97)
PLATELETS: 245 10*3/uL (ref 150–379)
RBC: 4.46 x10E6/uL (ref 3.77–5.28)
RDW: 14.6 % (ref 12.3–15.4)
WBC: 7.1 10*3/uL (ref 3.4–10.8)

## 2018-04-11 LAB — TSH: TSH: 1.1 u[IU]/mL (ref 0.450–4.500)

## 2018-04-15 ENCOUNTER — Telehealth: Payer: Self-pay | Admitting: Neurology

## 2018-04-15 NOTE — Telephone Encounter (Signed)
Called the pt and made aware of lab work WNL. Pt verbalized understanding. Pt had no questions at this time but was encouraged to call back if questions arise.

## 2018-04-15 NOTE — Telephone Encounter (Signed)
-----   Message from Anson Fret, MD sent at 04/11/2018  9:31 AM EDT ----- Labs unremarkable

## 2018-05-01 ENCOUNTER — Telehealth: Payer: Self-pay | Admitting: Neurology

## 2018-05-01 NOTE — Telephone Encounter (Signed)
I called the patient to make her aware that we will need copies of her insurances card to authorize the botox. She did not answer so I left a VM asking her to call me back.

## 2018-05-07 NOTE — Telephone Encounter (Signed)
I called the patient and got the information I needed from her card.

## 2018-05-13 ENCOUNTER — Ambulatory Visit (INDEPENDENT_AMBULATORY_CARE_PROVIDER_SITE_OTHER): Payer: Medicare HMO | Admitting: Neurology

## 2018-05-13 ENCOUNTER — Encounter: Payer: Self-pay | Admitting: Neurology

## 2018-05-13 VITALS — BP 113/75 | HR 64 | Ht 71.0 in | Wt 128.8 lb

## 2018-05-13 DIAGNOSIS — G43711 Chronic migraine without aura, intractable, with status migrainosus: Secondary | ICD-10-CM | POA: Diagnosis not present

## 2018-05-13 NOTE — Progress Notes (Signed)
Botox-100unitsx2 vials Lot: [id number] Expiration: 09/2020 NDC: [id number] [id number]  0.9% Sodium Chloride- 4mL total Lot: [id number] Expiration: 06/10/2019 NDC: [id number]  Dx: [id number] B/B

## 2018-05-14 MED ORDER — ERENUMAB-AOOE 140 MG/ML ~~LOC~~ SOAJ
140.0000 mg | SUBCUTANEOUS | 11 refills | Status: DC
Start: 2018-05-14 — End: 2022-05-16

## 2018-05-14 NOTE — Progress Notes (Signed)

## 2018-05-19 DIAGNOSIS — I252 Old myocardial infarction: Secondary | ICD-10-CM | POA: Diagnosis not present

## 2018-05-19 DIAGNOSIS — K37 Unspecified appendicitis: Secondary | ICD-10-CM | POA: Diagnosis not present

## 2018-05-19 DIAGNOSIS — Z79899 Other long term (current) drug therapy: Secondary | ICD-10-CM | POA: Diagnosis not present

## 2018-05-19 DIAGNOSIS — K353 Acute appendicitis with localized peritonitis, without perforation or gangrene: Secondary | ICD-10-CM | POA: Diagnosis not present

## 2018-05-19 DIAGNOSIS — F17210 Nicotine dependence, cigarettes, uncomplicated: Secondary | ICD-10-CM | POA: Diagnosis not present

## 2018-05-19 DIAGNOSIS — R1031 Right lower quadrant pain: Secondary | ICD-10-CM | POA: Diagnosis not present

## 2018-05-19 DIAGNOSIS — R1110 Vomiting, unspecified: Secondary | ICD-10-CM | POA: Diagnosis not present

## 2018-05-20 DIAGNOSIS — R1031 Right lower quadrant pain: Secondary | ICD-10-CM | POA: Diagnosis not present

## 2018-05-20 DIAGNOSIS — K37 Unspecified appendicitis: Secondary | ICD-10-CM | POA: Diagnosis not present

## 2018-05-20 DIAGNOSIS — R1110 Vomiting, unspecified: Secondary | ICD-10-CM | POA: Diagnosis not present

## 2018-05-20 DIAGNOSIS — K3580 Unspecified acute appendicitis: Secondary | ICD-10-CM | POA: Diagnosis not present

## 2018-05-20 DIAGNOSIS — R109 Unspecified abdominal pain: Secondary | ICD-10-CM | POA: Diagnosis not present

## 2018-05-20 DIAGNOSIS — K353 Acute appendicitis with localized peritonitis, without perforation or gangrene: Secondary | ICD-10-CM | POA: Diagnosis not present

## 2018-05-21 DIAGNOSIS — R109 Unspecified abdominal pain: Secondary | ICD-10-CM | POA: Diagnosis not present

## 2018-05-21 DIAGNOSIS — K37 Unspecified appendicitis: Secondary | ICD-10-CM | POA: Diagnosis not present

## 2018-06-03 DIAGNOSIS — N3010 Interstitial cystitis (chronic) without hematuria: Secondary | ICD-10-CM | POA: Diagnosis not present

## 2018-07-04 DIAGNOSIS — Z681 Body mass index (BMI) 19 or less, adult: Secondary | ICD-10-CM | POA: Diagnosis not present

## 2018-07-04 DIAGNOSIS — N3010 Interstitial cystitis (chronic) without hematuria: Secondary | ICD-10-CM | POA: Diagnosis not present

## 2018-07-04 DIAGNOSIS — Q048 Other specified congenital malformations of brain: Secondary | ICD-10-CM | POA: Diagnosis not present

## 2018-07-04 DIAGNOSIS — R636 Underweight: Secondary | ICD-10-CM | POA: Diagnosis not present

## 2018-07-04 DIAGNOSIS — F17200 Nicotine dependence, unspecified, uncomplicated: Secondary | ICD-10-CM | POA: Diagnosis not present

## 2018-07-04 DIAGNOSIS — K219 Gastro-esophageal reflux disease without esophagitis: Secondary | ICD-10-CM | POA: Diagnosis not present

## 2018-07-04 DIAGNOSIS — G43909 Migraine, unspecified, not intractable, without status migrainosus: Secondary | ICD-10-CM | POA: Diagnosis not present

## 2018-08-14 ENCOUNTER — Ambulatory Visit (INDEPENDENT_AMBULATORY_CARE_PROVIDER_SITE_OTHER): Payer: Medicare HMO | Admitting: Neurology

## 2018-08-14 DIAGNOSIS — G43711 Chronic migraine without aura, intractable, with status migrainosus: Secondary | ICD-10-CM

## 2018-08-14 NOTE — Progress Notes (Signed)
Botox- 100 units x 2 vials Lot: [id number] Expiration: 02/2021 NDC: 9371-6967-89  Bacteriostatic 0.9% Sodium Chloride- 41mL total Lot: [id number] Expiration: 09/10/2019 NDC: 5102-5852-77  Dx: O24.235 B/B

## 2018-08-14 NOTE — Progress Notes (Signed)
 Consent Form Botulism Toxin Injection For Chronic Migraine   +eyes, +masseters, +temples, + LS  Reviewed orally with patient, additionally signature is on file:  Botulism toxin has been approved by the Federal drug administration for treatment of chronic migraine. Botulism toxin does not cure chronic migraine and it may not be effective in some patients.  The administration of botulism toxin is accomplished by injecting a small amount of toxin into the muscles of the neck and head. Dosage must be titrated for each individual. Any benefits resulting from botulism toxin tend to wear off after 3 months with a repeat injection required if benefit is to be maintained. Injections are usually done every 3-4 months with maximum effect peak achieved by about 2 or 3 weeks. Botulism toxin is expensive and you should be sure of what costs you will incur resulting from the injection.  The side effects of botulism toxin use for chronic migraine may include:   -Transient, and usually mild, facial weakness with facial injections  -Transient, and usually mild, head or neck weakness with head/neck injections  -Reduction or loss of forehead facial animation due to forehead muscle weakness  -Eyelid drooping  -Dry eye  -Pain at the site of injection or bruising at the site of injection  -Double vision  -Potential unknown long term risks  Contraindications: You should not have Botox if you are pregnant, nursing, allergic to albumin, have an infection, skin condition, or muscle weakness at the site of the injection, or have myasthenia gravis, Lambert-Eaton syndrome, or ALS.  It is also possible that as with any injection, there may be an allergic reaction or no effect from the medication. Reduced effectiveness after repeated injections is sometimes seen and rarely infection at the injection site may occur. All care will be taken to prevent these side effects. If therapy is given over a long time, atrophy and  wasting in the muscle injected may occur. Occasionally the patient's become refractory to treatment because they develop antibodies to the toxin. In this event, therapy needs to be modified.  I have read the above information and consent to the administration of botulism toxin.    BOTOX PROCEDURE NOTE FOR MIGRAINE HEADACHE    Contraindications and precautions discussed with patient(above). Aseptic procedure was observed and patient tolerated procedure. Procedure performed by Dr. Toni Ahern  The condition has existed for more than 6 months, and pt does not have a diagnosis of ALS, Myasthenia Gravis or Lambert-Eaton Syndrome.  Risks and benefits of injections discussed and pt agrees to proceed with the procedure.  Written consent obtained  These injections are medically necessary. Pt  receives good benefits from these injections. These injections do not cause sedations or hallucinations which the oral therapies may cause.  Indication/Diagnosis: chronic migraine BOTOX(J0585) injection was performed according to protocol by [employer]. 200 units of BOTOX was dissolved into 4 cc NS.   NDC: [address]-01   Description of procedure:  The patient was placed in a sitting position. The standard protocol was used for Botox as follows, with 5 units of Botox injected at each site:   -Procerus muscle, midline injection  -Corrugator muscle, bilateral injection  -Frontalis muscle, bilateral injection, with 2 sites each side, medial injection was performed in the upper one third of the frontalis muscle, in the region vertical from the medial inferior edge of the superior orbital rim. The lateral injection was again in the upper one third of the forehead vertically above the lateral limbus of the cornea, 1.5   cm lateral to the medial injection site.  -Temporalis muscle injection, 4 sites, bilaterally. The first injection was 3 cm above the tragus of the ear, second injection site was 1.5 cm to 3 cm up  from the first injection site in line with the tragus of the ear. The third injection site was 1.5-3 cm forward between the first 2 injection sites. The fourth injection site was 1.5 cm posterior to the second injection site.  -Occipitalis muscle injection, 3 sites, bilaterally. The first injection was done one half way between the occipital protuberance and the tip of the mastoid process behind the ear. The second injection site was done lateral and superior to the first, 1 fingerbreadth from the first injection. The third injection site was 1 fingerbreadth superiorly and medially from the first injection site.  -Cervical paraspinal muscle injection, 2 sites, bilateral knee first injection site was 1 cm from the midline of the cervical spine, 3 cm inferior to the lower border of the occipital protuberance. The second injection site was 1.5 cm superiorly and laterally to the first injection site.  -Trapezius muscle injection was performed at 3 sites, bilaterally. The first injection site was in the upper trapezius muscle halfway between the inflection point of the neck, and the acromion. The second injection site was one half way between the acromion and the first injection site. The third injection was done between the first injection site and the inflection point of the neck.   Will return for repeat injection in 3 months.   A 200 unit sof Botox was used, 155 units were injected, the rest of the Botox was wasted. The patient tolerated the procedure well, there were no complications of the above procedure.   

## 2018-09-01 DIAGNOSIS — R3129 Other microscopic hematuria: Secondary | ICD-10-CM | POA: Diagnosis not present

## 2018-09-01 DIAGNOSIS — N3011 Interstitial cystitis (chronic) with hematuria: Secondary | ICD-10-CM | POA: Diagnosis not present

## 2018-09-01 DIAGNOSIS — R823 Hemoglobinuria: Secondary | ICD-10-CM | POA: Diagnosis not present

## 2018-09-04 DIAGNOSIS — R636 Underweight: Secondary | ICD-10-CM | POA: Diagnosis not present

## 2018-09-04 DIAGNOSIS — Z681 Body mass index (BMI) 19 or less, adult: Secondary | ICD-10-CM | POA: Diagnosis not present

## 2018-09-04 DIAGNOSIS — K219 Gastro-esophageal reflux disease without esophagitis: Secondary | ICD-10-CM | POA: Diagnosis not present

## 2018-09-04 DIAGNOSIS — F419 Anxiety disorder, unspecified: Secondary | ICD-10-CM | POA: Diagnosis not present

## 2018-09-04 DIAGNOSIS — G43909 Migraine, unspecified, not intractable, without status migrainosus: Secondary | ICD-10-CM | POA: Diagnosis not present

## 2018-09-04 DIAGNOSIS — Q048 Other specified congenital malformations of brain: Secondary | ICD-10-CM | POA: Diagnosis not present

## 2018-09-04 DIAGNOSIS — N3010 Interstitial cystitis (chronic) without hematuria: Secondary | ICD-10-CM | POA: Diagnosis not present

## 2018-09-04 DIAGNOSIS — F332 Major depressive disorder, recurrent severe without psychotic features: Secondary | ICD-10-CM | POA: Diagnosis not present

## 2018-09-04 DIAGNOSIS — F17200 Nicotine dependence, unspecified, uncomplicated: Secondary | ICD-10-CM | POA: Diagnosis not present

## 2018-10-09 ENCOUNTER — Telehealth: Payer: Self-pay | Admitting: Neurology

## 2018-10-09 NOTE — Telephone Encounter (Signed)
Pt has called stating she has been informed that for her Erenumab-aooe (AIMOVIG) 140 MG/ML SOAJ a prior authorization is needed. Please process

## 2018-10-09 NOTE — Telephone Encounter (Signed)
PA will be done

## 2018-10-10 NOTE — Telephone Encounter (Signed)
PA pending for Aimovig on cover my meds. If [employer] has not replied to your request within 24-72 hours please contact [employer] at [phone].

## 2018-10-13 NOTE — Telephone Encounter (Signed)
PA approve from 10/09/2018 to 12/10/2019. Contact number is [phone].

## 2018-11-14 ENCOUNTER — Telehealth: Payer: Self-pay | Admitting: *Deleted

## 2018-11-14 ENCOUNTER — Ambulatory Visit: Payer: Medicare HMO | Admitting: Neurology

## 2018-11-14 NOTE — Telephone Encounter (Signed)
Pt no showed botox appt @ 11:00 AM 11/14/2018.

## 2018-11-17 ENCOUNTER — Encounter: Payer: Self-pay | Admitting: Neurology

## 2018-12-15 DIAGNOSIS — N3011 Interstitial cystitis (chronic) with hematuria: Secondary | ICD-10-CM | POA: Diagnosis not present

## 2019-01-12 DIAGNOSIS — Z01419 Encounter for gynecological examination (general) (routine) without abnormal findings: Secondary | ICD-10-CM | POA: Diagnosis not present

## 2019-01-12 DIAGNOSIS — N83291 Other ovarian cyst, right side: Secondary | ICD-10-CM | POA: Diagnosis not present

## 2019-01-12 DIAGNOSIS — R1031 Right lower quadrant pain: Secondary | ICD-10-CM | POA: Diagnosis not present

## 2019-01-12 DIAGNOSIS — Z8041 Family history of malignant neoplasm of ovary: Secondary | ICD-10-CM | POA: Diagnosis not present

## 2019-01-12 DIAGNOSIS — R61 Generalized hyperhidrosis: Secondary | ICD-10-CM | POA: Diagnosis not present

## 2019-02-05 DIAGNOSIS — L400 Psoriasis vulgaris: Secondary | ICD-10-CM | POA: Diagnosis not present

## 2019-03-05 DIAGNOSIS — L4050 Arthropathic psoriasis, unspecified: Secondary | ICD-10-CM | POA: Diagnosis not present

## 2019-03-05 DIAGNOSIS — L400 Psoriasis vulgaris: Secondary | ICD-10-CM | POA: Diagnosis not present

## 2020-05-10 DIAGNOSIS — R636 Underweight: Secondary | ICD-10-CM | POA: Diagnosis not present

## 2020-05-10 DIAGNOSIS — F17200 Nicotine dependence, unspecified, uncomplicated: Secondary | ICD-10-CM | POA: Diagnosis not present

## 2020-05-10 DIAGNOSIS — K219 Gastro-esophageal reflux disease without esophagitis: Secondary | ICD-10-CM | POA: Diagnosis not present

## 2020-05-10 DIAGNOSIS — N3010 Interstitial cystitis (chronic) without hematuria: Secondary | ICD-10-CM | POA: Diagnosis not present

## 2020-05-10 DIAGNOSIS — H6092 Unspecified otitis externa, left ear: Secondary | ICD-10-CM | POA: Diagnosis not present

## 2020-05-10 DIAGNOSIS — F332 Major depressive disorder, recurrent severe without psychotic features: Secondary | ICD-10-CM | POA: Diagnosis not present

## 2020-05-10 DIAGNOSIS — G43909 Migraine, unspecified, not intractable, without status migrainosus: Secondary | ICD-10-CM | POA: Diagnosis not present

## 2020-05-10 DIAGNOSIS — H6692 Otitis media, unspecified, left ear: Secondary | ICD-10-CM | POA: Diagnosis not present

## 2020-05-10 DIAGNOSIS — Q048 Other specified congenital malformations of brain: Secondary | ICD-10-CM | POA: Diagnosis not present

## 2020-05-12 DIAGNOSIS — H9209 Otalgia, unspecified ear: Secondary | ICD-10-CM | POA: Diagnosis not present

## 2020-05-17 DIAGNOSIS — Q048 Other specified congenital malformations of brain: Secondary | ICD-10-CM | POA: Diagnosis not present

## 2020-05-17 DIAGNOSIS — F17200 Nicotine dependence, unspecified, uncomplicated: Secondary | ICD-10-CM | POA: Diagnosis not present

## 2020-05-17 DIAGNOSIS — Z681 Body mass index (BMI) 19 or less, adult: Secondary | ICD-10-CM | POA: Diagnosis not present

## 2020-05-17 DIAGNOSIS — R636 Underweight: Secondary | ICD-10-CM | POA: Diagnosis not present

## 2020-05-17 DIAGNOSIS — F419 Anxiety disorder, unspecified: Secondary | ICD-10-CM | POA: Diagnosis not present

## 2020-05-17 DIAGNOSIS — K219 Gastro-esophageal reflux disease without esophagitis: Secondary | ICD-10-CM | POA: Diagnosis not present

## 2020-05-17 DIAGNOSIS — F332 Major depressive disorder, recurrent severe without psychotic features: Secondary | ICD-10-CM | POA: Diagnosis not present

## 2020-05-17 DIAGNOSIS — N3010 Interstitial cystitis (chronic) without hematuria: Secondary | ICD-10-CM | POA: Diagnosis not present

## 2020-05-17 DIAGNOSIS — G43909 Migraine, unspecified, not intractable, without status migrainosus: Secondary | ICD-10-CM | POA: Diagnosis not present

## 2020-05-31 DIAGNOSIS — R636 Underweight: Secondary | ICD-10-CM | POA: Diagnosis not present

## 2020-05-31 DIAGNOSIS — F332 Major depressive disorder, recurrent severe without psychotic features: Secondary | ICD-10-CM | POA: Diagnosis not present

## 2020-05-31 DIAGNOSIS — F17200 Nicotine dependence, unspecified, uncomplicated: Secondary | ICD-10-CM | POA: Diagnosis not present

## 2020-05-31 DIAGNOSIS — Z681 Body mass index (BMI) 19 or less, adult: Secondary | ICD-10-CM | POA: Diagnosis not present

## 2020-05-31 DIAGNOSIS — N3010 Interstitial cystitis (chronic) without hematuria: Secondary | ICD-10-CM | POA: Diagnosis not present

## 2020-05-31 DIAGNOSIS — G43909 Migraine, unspecified, not intractable, without status migrainosus: Secondary | ICD-10-CM | POA: Diagnosis not present

## 2020-05-31 DIAGNOSIS — K219 Gastro-esophageal reflux disease without esophagitis: Secondary | ICD-10-CM | POA: Diagnosis not present

## 2020-05-31 DIAGNOSIS — F419 Anxiety disorder, unspecified: Secondary | ICD-10-CM | POA: Diagnosis not present

## 2020-05-31 DIAGNOSIS — Q048 Other specified congenital malformations of brain: Secondary | ICD-10-CM | POA: Diagnosis not present

## 2020-06-28 DIAGNOSIS — F17200 Nicotine dependence, unspecified, uncomplicated: Secondary | ICD-10-CM | POA: Diagnosis not present

## 2020-06-28 DIAGNOSIS — Q048 Other specified congenital malformations of brain: Secondary | ICD-10-CM | POA: Diagnosis not present

## 2020-06-28 DIAGNOSIS — H6092 Unspecified otitis externa, left ear: Secondary | ICD-10-CM | POA: Diagnosis not present

## 2020-06-28 DIAGNOSIS — K219 Gastro-esophageal reflux disease without esophagitis: Secondary | ICD-10-CM | POA: Diagnosis not present

## 2020-06-28 DIAGNOSIS — F419 Anxiety disorder, unspecified: Secondary | ICD-10-CM | POA: Diagnosis not present

## 2020-06-28 DIAGNOSIS — G43909 Migraine, unspecified, not intractable, without status migrainosus: Secondary | ICD-10-CM | POA: Diagnosis not present

## 2020-06-28 DIAGNOSIS — H6692 Otitis media, unspecified, left ear: Secondary | ICD-10-CM | POA: Diagnosis not present

## 2020-06-28 DIAGNOSIS — N3010 Interstitial cystitis (chronic) without hematuria: Secondary | ICD-10-CM | POA: Diagnosis not present

## 2020-06-28 DIAGNOSIS — F332 Major depressive disorder, recurrent severe without psychotic features: Secondary | ICD-10-CM | POA: Diagnosis not present

## 2020-07-05 DIAGNOSIS — N3010 Interstitial cystitis (chronic) without hematuria: Secondary | ICD-10-CM | POA: Diagnosis not present

## 2020-07-05 DIAGNOSIS — F419 Anxiety disorder, unspecified: Secondary | ICD-10-CM | POA: Diagnosis not present

## 2020-07-05 DIAGNOSIS — Q048 Other specified congenital malformations of brain: Secondary | ICD-10-CM | POA: Diagnosis not present

## 2020-07-05 DIAGNOSIS — R636 Underweight: Secondary | ICD-10-CM | POA: Diagnosis not present

## 2020-07-05 DIAGNOSIS — F17200 Nicotine dependence, unspecified, uncomplicated: Secondary | ICD-10-CM | POA: Diagnosis not present

## 2020-07-05 DIAGNOSIS — K219 Gastro-esophageal reflux disease without esophagitis: Secondary | ICD-10-CM | POA: Diagnosis not present

## 2020-07-05 DIAGNOSIS — H6123 Impacted cerumen, bilateral: Secondary | ICD-10-CM | POA: Diagnosis not present

## 2020-07-05 DIAGNOSIS — F332 Major depressive disorder, recurrent severe without psychotic features: Secondary | ICD-10-CM | POA: Diagnosis not present

## 2020-07-05 DIAGNOSIS — G43909 Migraine, unspecified, not intractable, without status migrainosus: Secondary | ICD-10-CM | POA: Diagnosis not present

## 2020-11-25 DIAGNOSIS — Z1331 Encounter for screening for depression: Secondary | ICD-10-CM | POA: Diagnosis not present

## 2020-11-25 DIAGNOSIS — Z0000 Encounter for general adult medical examination without abnormal findings: Secondary | ICD-10-CM | POA: Diagnosis not present

## 2020-11-25 DIAGNOSIS — Z9181 History of falling: Secondary | ICD-10-CM | POA: Diagnosis not present

## 2020-11-30 DIAGNOSIS — R509 Fever, unspecified: Secondary | ICD-10-CM | POA: Diagnosis not present

## 2020-11-30 DIAGNOSIS — Z20828 Contact with and (suspected) exposure to other viral communicable diseases: Secondary | ICD-10-CM | POA: Diagnosis not present

## 2020-11-30 DIAGNOSIS — J111 Influenza due to unidentified influenza virus with other respiratory manifestations: Secondary | ICD-10-CM | POA: Diagnosis not present

## 2020-11-30 DIAGNOSIS — R051 Acute cough: Secondary | ICD-10-CM | POA: Diagnosis not present

## 2021-09-13 DIAGNOSIS — R636 Underweight: Secondary | ICD-10-CM | POA: Diagnosis not present

## 2021-09-13 DIAGNOSIS — K219 Gastro-esophageal reflux disease without esophagitis: Secondary | ICD-10-CM | POA: Diagnosis not present

## 2021-09-13 DIAGNOSIS — Q048 Other specified congenital malformations of brain: Secondary | ICD-10-CM | POA: Diagnosis not present

## 2021-09-13 DIAGNOSIS — R300 Dysuria: Secondary | ICD-10-CM | POA: Diagnosis not present

## 2021-09-13 DIAGNOSIS — G43909 Migraine, unspecified, not intractable, without status migrainosus: Secondary | ICD-10-CM | POA: Diagnosis not present

## 2021-09-13 DIAGNOSIS — R509 Fever, unspecified: Secondary | ICD-10-CM | POA: Diagnosis not present

## 2021-09-13 DIAGNOSIS — N3010 Interstitial cystitis (chronic) without hematuria: Secondary | ICD-10-CM | POA: Diagnosis not present

## 2021-09-13 DIAGNOSIS — M25551 Pain in right hip: Secondary | ICD-10-CM | POA: Diagnosis not present

## 2021-09-13 DIAGNOSIS — M549 Dorsalgia, unspecified: Secondary | ICD-10-CM | POA: Diagnosis not present

## 2021-09-13 DIAGNOSIS — F419 Anxiety disorder, unspecified: Secondary | ICD-10-CM | POA: Diagnosis not present

## 2021-09-13 DIAGNOSIS — F17200 Nicotine dependence, unspecified, uncomplicated: Secondary | ICD-10-CM | POA: Diagnosis not present

## 2021-09-19 DIAGNOSIS — F17200 Nicotine dependence, unspecified, uncomplicated: Secondary | ICD-10-CM | POA: Diagnosis not present

## 2021-09-19 DIAGNOSIS — Z6820 Body mass index (BMI) 20.0-20.9, adult: Secondary | ICD-10-CM | POA: Diagnosis not present

## 2021-09-19 DIAGNOSIS — K219 Gastro-esophageal reflux disease without esophagitis: Secondary | ICD-10-CM | POA: Diagnosis not present

## 2021-09-19 DIAGNOSIS — F419 Anxiety disorder, unspecified: Secondary | ICD-10-CM | POA: Diagnosis not present

## 2021-09-19 DIAGNOSIS — N3010 Interstitial cystitis (chronic) without hematuria: Secondary | ICD-10-CM | POA: Diagnosis not present

## 2021-09-19 DIAGNOSIS — G43909 Migraine, unspecified, not intractable, without status migrainosus: Secondary | ICD-10-CM | POA: Diagnosis not present

## 2021-09-19 DIAGNOSIS — R636 Underweight: Secondary | ICD-10-CM | POA: Diagnosis not present

## 2021-09-19 DIAGNOSIS — F332 Major depressive disorder, recurrent severe without psychotic features: Secondary | ICD-10-CM | POA: Diagnosis not present

## 2021-09-19 DIAGNOSIS — Q048 Other specified congenital malformations of brain: Secondary | ICD-10-CM | POA: Diagnosis not present

## 2021-09-27 DIAGNOSIS — N3289 Other specified disorders of bladder: Secondary | ICD-10-CM | POA: Diagnosis not present

## 2021-09-27 DIAGNOSIS — R110 Nausea: Secondary | ICD-10-CM | POA: Diagnosis not present

## 2021-09-27 DIAGNOSIS — D7389 Other diseases of spleen: Secondary | ICD-10-CM | POA: Diagnosis not present

## 2021-09-27 DIAGNOSIS — R1013 Epigastric pain: Secondary | ICD-10-CM | POA: Diagnosis not present

## 2021-09-27 DIAGNOSIS — Z79899 Other long term (current) drug therapy: Secondary | ICD-10-CM | POA: Diagnosis not present

## 2021-09-27 DIAGNOSIS — I700 Atherosclerosis of aorta: Secondary | ICD-10-CM | POA: Diagnosis not present

## 2021-09-27 DIAGNOSIS — Z9049 Acquired absence of other specified parts of digestive tract: Secondary | ICD-10-CM | POA: Diagnosis not present

## 2021-09-27 DIAGNOSIS — R1011 Right upper quadrant pain: Secondary | ICD-10-CM | POA: Diagnosis not present

## 2021-09-27 DIAGNOSIS — R112 Nausea with vomiting, unspecified: Secondary | ICD-10-CM | POA: Diagnosis not present

## 2021-10-17 DIAGNOSIS — F17200 Nicotine dependence, unspecified, uncomplicated: Secondary | ICD-10-CM | POA: Diagnosis not present

## 2021-10-17 DIAGNOSIS — Z681 Body mass index (BMI) 19 or less, adult: Secondary | ICD-10-CM | POA: Diagnosis not present

## 2021-10-17 DIAGNOSIS — F3341 Major depressive disorder, recurrent, in partial remission: Secondary | ICD-10-CM | POA: Diagnosis not present

## 2021-10-17 DIAGNOSIS — G43909 Migraine, unspecified, not intractable, without status migrainosus: Secondary | ICD-10-CM | POA: Diagnosis not present

## 2021-10-17 DIAGNOSIS — R636 Underweight: Secondary | ICD-10-CM | POA: Diagnosis not present

## 2021-10-17 DIAGNOSIS — F419 Anxiety disorder, unspecified: Secondary | ICD-10-CM | POA: Diagnosis not present

## 2021-10-17 DIAGNOSIS — K219 Gastro-esophageal reflux disease without esophagitis: Secondary | ICD-10-CM | POA: Diagnosis not present

## 2021-10-17 DIAGNOSIS — N3010 Interstitial cystitis (chronic) without hematuria: Secondary | ICD-10-CM | POA: Diagnosis not present

## 2021-10-17 DIAGNOSIS — Q048 Other specified congenital malformations of brain: Secondary | ICD-10-CM | POA: Diagnosis not present

## 2021-11-14 DIAGNOSIS — F3341 Major depressive disorder, recurrent, in partial remission: Secondary | ICD-10-CM | POA: Diagnosis not present

## 2021-11-14 DIAGNOSIS — F17200 Nicotine dependence, unspecified, uncomplicated: Secondary | ICD-10-CM | POA: Diagnosis not present

## 2021-11-14 DIAGNOSIS — G43909 Migraine, unspecified, not intractable, without status migrainosus: Secondary | ICD-10-CM | POA: Diagnosis not present

## 2021-11-14 DIAGNOSIS — Q048 Other specified congenital malformations of brain: Secondary | ICD-10-CM | POA: Diagnosis not present

## 2021-11-14 DIAGNOSIS — K219 Gastro-esophageal reflux disease without esophagitis: Secondary | ICD-10-CM | POA: Diagnosis not present

## 2021-11-14 DIAGNOSIS — R636 Underweight: Secondary | ICD-10-CM | POA: Diagnosis not present

## 2021-11-14 DIAGNOSIS — N3010 Interstitial cystitis (chronic) without hematuria: Secondary | ICD-10-CM | POA: Diagnosis not present

## 2021-11-14 DIAGNOSIS — F419 Anxiety disorder, unspecified: Secondary | ICD-10-CM | POA: Diagnosis not present

## 2021-11-14 DIAGNOSIS — M25551 Pain in right hip: Secondary | ICD-10-CM | POA: Diagnosis not present

## 2021-11-28 DIAGNOSIS — K219 Gastro-esophageal reflux disease without esophagitis: Secondary | ICD-10-CM | POA: Diagnosis not present

## 2021-11-28 DIAGNOSIS — G43909 Migraine, unspecified, not intractable, without status migrainosus: Secondary | ICD-10-CM | POA: Diagnosis not present

## 2021-11-28 DIAGNOSIS — F3341 Major depressive disorder, recurrent, in partial remission: Secondary | ICD-10-CM | POA: Diagnosis not present

## 2021-11-28 DIAGNOSIS — M25551 Pain in right hip: Secondary | ICD-10-CM | POA: Diagnosis not present

## 2021-11-28 DIAGNOSIS — Z9181 History of falling: Secondary | ICD-10-CM | POA: Diagnosis not present

## 2021-11-28 DIAGNOSIS — Z0000 Encounter for general adult medical examination without abnormal findings: Secondary | ICD-10-CM | POA: Diagnosis not present

## 2021-11-28 DIAGNOSIS — Q048 Other specified congenital malformations of brain: Secondary | ICD-10-CM | POA: Diagnosis not present

## 2021-11-28 DIAGNOSIS — N3010 Interstitial cystitis (chronic) without hematuria: Secondary | ICD-10-CM | POA: Diagnosis not present

## 2021-11-28 DIAGNOSIS — Z1331 Encounter for screening for depression: Secondary | ICD-10-CM | POA: Diagnosis not present

## 2021-12-12 DIAGNOSIS — R636 Underweight: Secondary | ICD-10-CM | POA: Diagnosis not present

## 2021-12-12 DIAGNOSIS — F3341 Major depressive disorder, recurrent, in partial remission: Secondary | ICD-10-CM | POA: Diagnosis not present

## 2021-12-12 DIAGNOSIS — K219 Gastro-esophageal reflux disease without esophagitis: Secondary | ICD-10-CM | POA: Diagnosis not present

## 2021-12-12 DIAGNOSIS — N3010 Interstitial cystitis (chronic) without hematuria: Secondary | ICD-10-CM | POA: Diagnosis not present

## 2021-12-12 DIAGNOSIS — Z681 Body mass index (BMI) 19 or less, adult: Secondary | ICD-10-CM | POA: Diagnosis not present

## 2021-12-12 DIAGNOSIS — F419 Anxiety disorder, unspecified: Secondary | ICD-10-CM | POA: Diagnosis not present

## 2021-12-12 DIAGNOSIS — F17200 Nicotine dependence, unspecified, uncomplicated: Secondary | ICD-10-CM | POA: Diagnosis not present

## 2021-12-12 DIAGNOSIS — G43909 Migraine, unspecified, not intractable, without status migrainosus: Secondary | ICD-10-CM | POA: Diagnosis not present

## 2021-12-12 DIAGNOSIS — Q048 Other specified congenital malformations of brain: Secondary | ICD-10-CM | POA: Diagnosis not present

## 2022-01-09 DIAGNOSIS — F3341 Major depressive disorder, recurrent, in partial remission: Secondary | ICD-10-CM | POA: Diagnosis not present

## 2022-01-09 DIAGNOSIS — F17200 Nicotine dependence, unspecified, uncomplicated: Secondary | ICD-10-CM | POA: Diagnosis not present

## 2022-01-09 DIAGNOSIS — K219 Gastro-esophageal reflux disease without esophagitis: Secondary | ICD-10-CM | POA: Diagnosis not present

## 2022-01-09 DIAGNOSIS — F419 Anxiety disorder, unspecified: Secondary | ICD-10-CM | POA: Diagnosis not present

## 2022-01-09 DIAGNOSIS — N3010 Interstitial cystitis (chronic) without hematuria: Secondary | ICD-10-CM | POA: Diagnosis not present

## 2022-01-09 DIAGNOSIS — R636 Underweight: Secondary | ICD-10-CM | POA: Diagnosis not present

## 2022-01-09 DIAGNOSIS — Q048 Other specified congenital malformations of brain: Secondary | ICD-10-CM | POA: Diagnosis not present

## 2022-01-09 DIAGNOSIS — M25552 Pain in left hip: Secondary | ICD-10-CM | POA: Diagnosis not present

## 2022-01-09 DIAGNOSIS — G43909 Migraine, unspecified, not intractable, without status migrainosus: Secondary | ICD-10-CM | POA: Diagnosis not present

## 2022-01-23 DIAGNOSIS — Q048 Other specified congenital malformations of brain: Secondary | ICD-10-CM | POA: Diagnosis not present

## 2022-01-23 DIAGNOSIS — M159 Polyosteoarthritis, unspecified: Secondary | ICD-10-CM | POA: Diagnosis not present

## 2022-01-23 DIAGNOSIS — R636 Underweight: Secondary | ICD-10-CM | POA: Diagnosis not present

## 2022-01-23 DIAGNOSIS — F17200 Nicotine dependence, unspecified, uncomplicated: Secondary | ICD-10-CM | POA: Diagnosis not present

## 2022-01-23 DIAGNOSIS — F3341 Major depressive disorder, recurrent, in partial remission: Secondary | ICD-10-CM | POA: Diagnosis not present

## 2022-01-23 DIAGNOSIS — N3010 Interstitial cystitis (chronic) without hematuria: Secondary | ICD-10-CM | POA: Diagnosis not present

## 2022-01-23 DIAGNOSIS — G43909 Migraine, unspecified, not intractable, without status migrainosus: Secondary | ICD-10-CM | POA: Diagnosis not present

## 2022-01-23 DIAGNOSIS — K219 Gastro-esophageal reflux disease without esophagitis: Secondary | ICD-10-CM | POA: Diagnosis not present

## 2022-01-23 DIAGNOSIS — F419 Anxiety disorder, unspecified: Secondary | ICD-10-CM | POA: Diagnosis not present

## 2022-02-06 DIAGNOSIS — H1030 Unspecified acute conjunctivitis, unspecified eye: Secondary | ICD-10-CM | POA: Diagnosis not present

## 2022-02-06 DIAGNOSIS — Q048 Other specified congenital malformations of brain: Secondary | ICD-10-CM | POA: Diagnosis not present

## 2022-02-06 DIAGNOSIS — F419 Anxiety disorder, unspecified: Secondary | ICD-10-CM | POA: Diagnosis not present

## 2022-02-06 DIAGNOSIS — K219 Gastro-esophageal reflux disease without esophagitis: Secondary | ICD-10-CM | POA: Diagnosis not present

## 2022-02-06 DIAGNOSIS — F3341 Major depressive disorder, recurrent, in partial remission: Secondary | ICD-10-CM | POA: Diagnosis not present

## 2022-02-06 DIAGNOSIS — M159 Polyosteoarthritis, unspecified: Secondary | ICD-10-CM | POA: Diagnosis not present

## 2022-02-06 DIAGNOSIS — N3010 Interstitial cystitis (chronic) without hematuria: Secondary | ICD-10-CM | POA: Diagnosis not present

## 2022-02-06 DIAGNOSIS — G43909 Migraine, unspecified, not intractable, without status migrainosus: Secondary | ICD-10-CM | POA: Diagnosis not present

## 2022-02-06 DIAGNOSIS — F17200 Nicotine dependence, unspecified, uncomplicated: Secondary | ICD-10-CM | POA: Diagnosis not present

## 2022-03-06 DIAGNOSIS — R42 Dizziness and giddiness: Secondary | ICD-10-CM | POA: Diagnosis not present

## 2022-03-06 DIAGNOSIS — F419 Anxiety disorder, unspecified: Secondary | ICD-10-CM | POA: Diagnosis not present

## 2022-03-06 DIAGNOSIS — L0390 Cellulitis, unspecified: Secondary | ICD-10-CM | POA: Diagnosis not present

## 2022-03-06 DIAGNOSIS — Q048 Other specified congenital malformations of brain: Secondary | ICD-10-CM | POA: Diagnosis not present

## 2022-03-06 DIAGNOSIS — M159 Polyosteoarthritis, unspecified: Secondary | ICD-10-CM | POA: Diagnosis not present

## 2022-03-06 DIAGNOSIS — F17200 Nicotine dependence, unspecified, uncomplicated: Secondary | ICD-10-CM | POA: Diagnosis not present

## 2022-03-06 DIAGNOSIS — N3010 Interstitial cystitis (chronic) without hematuria: Secondary | ICD-10-CM | POA: Diagnosis not present

## 2022-03-06 DIAGNOSIS — G43909 Migraine, unspecified, not intractable, without status migrainosus: Secondary | ICD-10-CM | POA: Diagnosis not present

## 2022-03-06 DIAGNOSIS — K219 Gastro-esophageal reflux disease without esophagitis: Secondary | ICD-10-CM | POA: Diagnosis not present

## 2022-03-20 DIAGNOSIS — M159 Polyosteoarthritis, unspecified: Secondary | ICD-10-CM | POA: Diagnosis not present

## 2022-03-20 DIAGNOSIS — F17200 Nicotine dependence, unspecified, uncomplicated: Secondary | ICD-10-CM | POA: Diagnosis not present

## 2022-03-20 DIAGNOSIS — F3341 Major depressive disorder, recurrent, in partial remission: Secondary | ICD-10-CM | POA: Diagnosis not present

## 2022-03-20 DIAGNOSIS — G43909 Migraine, unspecified, not intractable, without status migrainosus: Secondary | ICD-10-CM | POA: Diagnosis not present

## 2022-03-20 DIAGNOSIS — N3010 Interstitial cystitis (chronic) without hematuria: Secondary | ICD-10-CM | POA: Diagnosis not present

## 2022-03-20 DIAGNOSIS — R636 Underweight: Secondary | ICD-10-CM | POA: Diagnosis not present

## 2022-03-20 DIAGNOSIS — Q048 Other specified congenital malformations of brain: Secondary | ICD-10-CM | POA: Diagnosis not present

## 2022-03-20 DIAGNOSIS — K219 Gastro-esophageal reflux disease without esophagitis: Secondary | ICD-10-CM | POA: Diagnosis not present

## 2022-03-20 DIAGNOSIS — F419 Anxiety disorder, unspecified: Secondary | ICD-10-CM | POA: Diagnosis not present

## 2022-04-05 DIAGNOSIS — B342 Coronavirus infection, unspecified: Secondary | ICD-10-CM | POA: Diagnosis not present

## 2022-04-05 DIAGNOSIS — G43909 Migraine, unspecified, not intractable, without status migrainosus: Secondary | ICD-10-CM | POA: Diagnosis not present

## 2022-04-05 DIAGNOSIS — Z20822 Contact with and (suspected) exposure to covid-19: Secondary | ICD-10-CM | POA: Diagnosis not present

## 2022-04-17 DIAGNOSIS — F419 Anxiety disorder, unspecified: Secondary | ICD-10-CM | POA: Diagnosis not present

## 2022-04-17 DIAGNOSIS — K219 Gastro-esophageal reflux disease without esophagitis: Secondary | ICD-10-CM | POA: Diagnosis not present

## 2022-04-17 DIAGNOSIS — Q048 Other specified congenital malformations of brain: Secondary | ICD-10-CM | POA: Diagnosis not present

## 2022-04-17 DIAGNOSIS — R636 Underweight: Secondary | ICD-10-CM | POA: Diagnosis not present

## 2022-04-17 DIAGNOSIS — M159 Polyosteoarthritis, unspecified: Secondary | ICD-10-CM | POA: Diagnosis not present

## 2022-04-17 DIAGNOSIS — F17200 Nicotine dependence, unspecified, uncomplicated: Secondary | ICD-10-CM | POA: Diagnosis not present

## 2022-04-17 DIAGNOSIS — N3010 Interstitial cystitis (chronic) without hematuria: Secondary | ICD-10-CM | POA: Diagnosis not present

## 2022-04-17 DIAGNOSIS — F3341 Major depressive disorder, recurrent, in partial remission: Secondary | ICD-10-CM | POA: Diagnosis not present

## 2022-04-17 DIAGNOSIS — G43909 Migraine, unspecified, not intractable, without status migrainosus: Secondary | ICD-10-CM | POA: Diagnosis not present

## 2022-05-15 DIAGNOSIS — L409 Psoriasis, unspecified: Secondary | ICD-10-CM | POA: Diagnosis not present

## 2022-05-15 DIAGNOSIS — F419 Anxiety disorder, unspecified: Secondary | ICD-10-CM | POA: Diagnosis not present

## 2022-05-15 DIAGNOSIS — K219 Gastro-esophageal reflux disease without esophagitis: Secondary | ICD-10-CM | POA: Diagnosis not present

## 2022-05-15 DIAGNOSIS — M159 Polyosteoarthritis, unspecified: Secondary | ICD-10-CM | POA: Diagnosis not present

## 2022-05-15 DIAGNOSIS — F3341 Major depressive disorder, recurrent, in partial remission: Secondary | ICD-10-CM | POA: Diagnosis not present

## 2022-05-15 DIAGNOSIS — F17200 Nicotine dependence, unspecified, uncomplicated: Secondary | ICD-10-CM | POA: Diagnosis not present

## 2022-05-15 DIAGNOSIS — N3010 Interstitial cystitis (chronic) without hematuria: Secondary | ICD-10-CM | POA: Diagnosis not present

## 2022-05-15 DIAGNOSIS — Q048 Other specified congenital malformations of brain: Secondary | ICD-10-CM | POA: Diagnosis not present

## 2022-05-15 DIAGNOSIS — G43909 Migraine, unspecified, not intractable, without status migrainosus: Secondary | ICD-10-CM | POA: Diagnosis not present

## 2022-05-16 ENCOUNTER — Telehealth: Payer: Self-pay | Admitting: *Deleted

## 2022-05-16 ENCOUNTER — Ambulatory Visit (INDEPENDENT_AMBULATORY_CARE_PROVIDER_SITE_OTHER): Payer: Medicare HMO | Admitting: Neurology

## 2022-05-16 ENCOUNTER — Encounter: Payer: Self-pay | Admitting: Neurology

## 2022-05-16 VITALS — BP 116/73 | HR 76 | Ht 71.0 in | Wt 151.6 lb

## 2022-05-16 DIAGNOSIS — G43711 Chronic migraine without aura, intractable, with status migrainosus: Secondary | ICD-10-CM | POA: Diagnosis not present

## 2022-05-16 MED ORDER — RIZATRIPTAN BENZOATE 10 MG PO TBDP: 10.0000 mg | ORAL_TABLET | ORAL | 11 refills | Status: AC | PRN

## 2022-05-16 MED ORDER — ONDANSETRON 4 MG PO TBDP: 4.0000 mg | ORAL_TABLET | Freq: Three times a day (TID) | ORAL | 3 refills | Status: AC | PRN

## 2022-05-16 MED ORDER — UBRELVY 100 MG PO TABS
100.0000 mg | ORAL_TABLET | ORAL | 0 refills | Status: AC | PRN
Start: 2022-05-16 — End: ?

## 2022-05-16 MED ORDER — AIMOVIG 140 MG/ML ~~LOC~~ SOAJ: 140.0000 mg | SUBCUTANEOUS | 11 refills | Status: AC

## 2022-05-16 NOTE — Telephone Encounter (Signed)
CMM KEY [id number] aimovig 140mg /ml PA initiated.  Medications tried over 3 months each: Topiramate, Amitriptyline, meclizine, zofran, zonisamide, maxalt, sumatriptan, mirtazapine, flexeril, aimovg, 2 botox.  Message from [facility].  PA Case: [id number], Status: Approved, Coverage Starts on: 12/10/2021 12:00:00 AM, Coverage Ends on: 12/09/2022 12:00:00 AM. Questions? Contact [phone].  I let pt know with LMVM. She should be able to get at pharmacy.

## 2022-05-16 NOTE — Telephone Encounter (Signed)
Brittany Hopkins (Key: [id number]) Rx #: [id number] Aimovig 140MG /ML auto-injectors    PA Case: , Status: Approved, Coverage Starts on: 12/10/2021 12:00:00 AM, Coverage Ends on: 12/09/2022 12:00:00 AM. Questions? Contact [phone].

## 2022-05-16 NOTE — Progress Notes (Signed)
[id number]    Provider:  Dr Lucia GaskinsAhern Referring Provider: Simone CuriaLee, Keung, MD Primary Care Physician:  Simone CuriaLee, Keung, MD  CC:  Chronic Migraines  05/16/2022: Brittany Countsmily Susanne Montag is a 46 y.o. female here as a referral from Dr. Nedra HaiLee for migraines.  Past medical history benign positional vertigo, vestibular migraines,  cerebellar tonsillar ectopia, migraines, interstitial cystitis, vertigo. Started >20 years ago after having children. We have seen patient in the past for migraines. We started her on Aimovig. She had 2 botox appointments last 08/2018 and then no-showed and never came back. She says the botox was helping but she had a rash and turnedout to be psoriasis and she still has the rash. Her migraines are worsening, she stopped ibuprofen and tylenol, she has daily headaches, 8 migraine days a month that moderate to sever in intensity, has to go into a dark room, smell triggers, photophobia, nausea, vomiting every time, ongoing for . 1 year, no aura, no medication overuse, pulsating/pounding/throbbing and can last 24 hours. Hurts to move. We discussed options, She tried Aimovig in the past, she wants to try the aimovig again. No other focal neurologic deficits, associated symptoms, inciting events or modifiable factors.  Patient complains of symptoms per HPI as well as the following symptoms: migraines . Pertinent negatives and positives per HPI. All others negative   HPI 04/10/2018:  Brittany Countsmily Susanne Hopkins is a 46 y.o. female here as a referral from Dr. Nedra HaiLee for migraines.  Past medical history benign positional vertigo, vestibular migraines,  cerebellar tonsillar ectopia, migraines, interstitial cystitis, vertigo. Started 19 years ago after having babies. She has been to multiple doctors and tried multiple medications. Worsened over the last several years. She has daily headaches: can be unilateral, can be pounding, throbbing, with light and sound sensitivity, nausea and vomiting. Movement makes it  worse. She also feels stabbing and hot in the vertex of her head. Migraines can last 4-72 hours and be severe. No medication overuse. 30 are migrainous out of 30 days for over a year. She has a continuous headache. No aura. No medication overuse. She has associated dizziness and vertigo. Also tinnitus. No other focal neurologic deficits, associated symptoms, inciting events or modifiable factors.  Medications tried over 3 months each: Topiramate, Amitriptyline, meclizine, zofran, zonisamide, maxalt, sumatriptan, mirtazapine, flexeril, aimovg, 2 botox  Reviewed notes, labs and imaging from outside physicians, which showed:  Reviewed referring physicians notes.  Patient has migraine headaches.  Onset was gradual a year ago.  The patient describes it as moderate in severity and unchanged.  Symptoms include dizziness, weakness, syncope and positional vertigo.  The dizziness is described as lightheadedness, vertigo, and states that his sensation of movement, no inciting events, dizziness occurs daily, associated symptoms include nausea.  Also reports fatigue.  Reviewed exam which was normal.  Patient had an MRI of the head in June 2015, echocardiogram December 2014 and CT scan of the abdomen March 2015.  Labs include unremarkable CBC, unremarkable CMP with BUN 8 and creatinine 0.88, TSH normal 1.2 drawn February 07, 2018.  MRI brain in 2015, will try and retrieve records but per neurology notes The MRI was significant for bilateral mastoid effusions. This was reviewed with her. There was some cerebellar ectopia but no definite Chiari malformation.    MRI of Brain (06/02/14) - Cerebellar ectopia w/out definite Chiari Malformation otherwise normal MRI appearance of the brain. Bilateral mastoid effusions. No obstructing nasopharyngeal lesion is evident   Review of Systems: Patient complains of symptoms per  HPI as well as the following symptoms: headache, dizziness, not enough sleep, spinning sensation . Pertinent  negatives and positives per HPI. All others negative.   Social History   Socioeconomic History   Marital status: Single    Spouse name: Not on file   Number of children: Not on file   Years of education: Not on file   Highest education level: Not on file  Occupational History   Not on file  Tobacco Use   Smoking status: Every Day    Packs/day: 0.50    Types: Cigarettes   Smokeless tobacco: Never  Substance and Sexual Activity   Alcohol use: No   Drug use: No   Sexual activity: Not on file  Other Topics Concern   Not on file  Social History Narrative   Not on file   Social Determinants of Health   Financial Resource Strain: Not on file  Food Insecurity: Not on file  Transportation Needs: Not on file  Physical Activity: Not on file  Stress: Not on file  Social Connections: Not on file  Intimate Partner Violence: Not on file    Family History  Problem Relation Age of Onset   Migraines Neg Hx    Headache Neg Hx     Past Medical History:  Diagnosis Date   Heart attack (HCC)    11 years ago    Past Surgical History:  Procedure Laterality Date   NO PAST SURGERIES      Current Outpatient Medications  Medication Sig Dispense Refill   citalopram (CELEXA) 20 MG tablet Take 20 mg by mouth daily.     meclizine (ANTIVERT) 12.5 MG tablet Take 12.5 mg by mouth 3 (three) times daily as needed for dizziness.     meloxicam (MOBIC) 7.5 MG tablet Take 7.5 mg by mouth 2 (two) times daily.     pantoprazole (PROTONIX) 40 MG tablet Take 40 mg by mouth 2 (two) times daily.     Aspirin-Acetaminophen-Caffeine (GOODYS EXTRA STRENGTH PO) Take by mouth.     Erenumab-aooe (AIMOVIG) 140 MG/ML SOAJ Inject 140 mg into the skin every 30 (thirty) days. 1 pen 11   ibuprofen (ADVIL,MOTRIN) 200 MG tablet Take 200 mg by mouth every 6 (six) hours as needed. pain      No current facility-administered medications for this visit.    Allergies as of 05/16/2022   (No Known Allergies)     Vitals: BP 116/73   Pulse 76   Ht 5\' 11"  (1.803 m)   Wt 151 lb 9.6 oz (68.8 kg)   BMI 21.14 kg/m  Last Weight:  Wt Readings from Last 1 Encounters:  05/16/22 151 lb 9.6 oz (68.8 kg)   Last Height:   Ht Readings from Last 1 Encounters:  05/16/22 5\' 11"  (1.803 m)   Physical exam: Exam: Gen: NAD, conversant, well nourised, well groomed                     CV: RRR, no MRG. No Carotid Bruits. No peripheral edema, warm, nontender Eyes: Conjunctivae clear without exudates or hemorrhage  Neuro: Detailed Neurologic Exam  Speech:    Speech is normal; fluent and spontaneous with normal comprehension.  Cognition:    The patient is oriented to person, place, and time;     recent and remote memory intact;     language fluent;     normal attention, concentration,     fund of knowledge Cranial Nerves:    The pupils are  equal, round, and reactive to light. The fundi are flat. Visual fields are full to finger confrontation. Extraocular movements are intact. Trigeminal sensation is intact and the muscles of mastication are normal. The face is symmetric. The palate elevates in the midline. Hearing intact. Voice is normal. Shoulder shrug is normal. The tongue has normal motion without fasciculations.   Coordination:    Normal Gait:    normal.   Motor Observation:    No asymmetry, no atrophy, and no involuntary movements noted. Tone:    Normal muscle tone.    Posture:    Posture is normal. normal erect    Strength:    Strength is V/V in the upper and lower limbs.      Sensation: intact to LT     Reflex Exam:  DTR's:    Deep tendon reflexes in the upper and lower extremities are symmetrical bilaterally.   Toes:    The toes are downgoing bilaterally.   Clonus:    Clonus is absent.     Assessment/Plan:  46 year old female with intractable migraines has failed multiple classes of medication  -Preventative: Aimovig monthly -Rescue: Rizatripan: Please take one tablet at  the onset of your headache. If it does not improve the symptoms please take one additional tablet. Do not take more then 2 tablets in 24hrs. Do not take use more then 2 to 3 times in a week. Take it with ondansetron for nausea.  -Samples: can take it with the rizatriptan and ondansetron: Ubrelvy  - Unremarkable MRI in 2015, no change in frequency, quality or severity, no indication for MRI at this time, consider in the future if migraines do not improve with botox. Can't have mri due to stimulator placement.  Discussed: To prevent or relieve headaches, try the following: Cool Compress. Lie down and place a cool compress on your head.  Avoid headache triggers. If certain foods or odors seem to have triggered your migraines in the past, avoid them. A headache diary might help you identify triggers.  Include physical activity in your daily routine. Try a daily walk or other moderate aerobic exercise.  Manage stress. Find healthy ways to cope with the stressors, such as delegating tasks on your to-do list.  Practice relaxation techniques. Try deep breathing, yoga, massage and visualization.  Eat regularly. Eating regularly scheduled meals and maintaining a healthy diet might help prevent headaches. Also, drink plenty of fluids.  Follow a regular sleep schedule. Sleep deprivation might contribute to headaches Consider biofeedback. With this mind-body technique, you learn to control certain bodily functions -- such as muscle tension, heart rate and blood pressure -- to prevent headaches or reduce headache pain.    Proceed to emergency room if you experience new or worsening symptoms or symptoms do not resolve, if you have new neurologic symptoms or if headache is severe, or for any concerning symptom.   Provided education and documentation from [employer] toolbox including articles on: chronic migraine medication overuse headache, chronic migraines, prevention of migraines, behavioral and  other nonpharmacologic treatments for headache.  No orders of the defined types were placed in this encounter.   Cc: Simone Curia, MD  Naomie Dean, MD  [facility] [address] [place], [place] [address]  Phone [phone] Fax [phone]   I spent 45 minutes of face-to-face and non-face-to-face time with patient on the  1. Chronic migraine without aura, with intractable migraine, so stated, with status migrainosus    diagnosis.  This  included previsit chart review, lab review, study review, order entry, electronic health record documentation, patient education on the different diagnostic and therapeutic options, counseling and coordination of care, risks and benefits of management, compliance, or risk factor reduction

## 2022-05-16 NOTE — Patient Instructions (Addendum)
Preventative: Aimovig monthly Rescue: Rizatripan: Please take one tablet at the onset of your headache. If it does not improve the symptoms please take one additional tablet. Do not take more then 2 tablets in 24hrs. Do not take use more then 2 to 3 times in a week. Take it with ondansetron for nausea.  Samples of the ned medication: can take it with the rizatriptan and ondansetron: Ubrelvy   Meds ordered this encounter  Medications   Erenumab-aooe (AIMOVIG) 140 MG/ML SOAJ    Sig: Inject 140 mg into the skin every 30 (thirty) days.    Dispense:  1 mL    Refill:  11   rizatriptan (MAXALT-MLT) 10 MG disintegrating tablet    Sig: Take 1 tablet (10 mg total) by mouth as needed for migraine. May repeat in 2 hours if needed    Dispense:  9 tablet    Refill:  11   ondansetron (ZOFRAN-ODT) 4 MG disintegrating tablet    Sig: Take 1-2 tablets (4-8 mg total) by mouth every 8 (eight) hours as needed.    Dispense:  30 tablet    Refill:  3   Ubrogepant (UBRELVY) 100 MG TABS    Sig: Take 100 mg by mouth every 2 (two) hours as needed. Maximum  a day.    Dispense:  2 tablet    Refill:  0    Erenumab Injection What is this medication? ERENUMAB (e REN ue mab) prevents migraines. It works by blocking a substance in the body that causes migraines. It is a monoclonal antibody. This medicine may be used for other purposes; ask your health care provider or pharmacist if you have questions. COMMON BRAND NAME(S): Aimovig What should I tell my care team before I take this medication? They need to know if you have any of these conditions: High blood pressure An unusual or allergic reaction to erenumab, latex, other medications, foods, dyes, or preservatives Pregnant or trying to get pregnant Breast-feeding How should I use this medication? This medication is injected under the skin. You will be taught how to prepare and give it. Take it as directed on the prescription label. Keep taking it unless your  care team tells you to stop. It is important that you put your used needles and syringes in a special sharps container. Do not put them in a trash can. If you do not have a sharps container, call your pharmacist or care team to get one. Talk to your care team about the use of this medication in children. Special care may be needed. Overdosage: If you think you have taken too much of this medicine contact a poison control center or emergency room at once. NOTE: This medicine is only for you. Do not share this medicine with others. What if I miss a dose? If you miss a dose, take it as soon as you can. If it is almost time for your next dose, take only that dose. Do not take double or extra doses. What may interact with this medication? Interactions are not expected. This list may not describe all possible interactions. Give your health care provider a list of all the medicines, herbs, non-prescription drugs, or dietary supplements you use. Also tell them if you smoke, drink alcohol, or use illegal drugs. Some items may interact with your medicine. What should I watch for while using this medication? Tell your care team if your symptoms do not start to get better or if they get worse. What side effects may  I notice from receiving this medication? Side effects that you should report to your care team as soon as possible: Allergic reactions or angioedema--skin rash, itching or hives, swelling of the face, eyes, lips, tongue, arms, or legs, trouble swallowing or breathing Constipation, bloating, nausea or vomiting, stomach pain, which may be signs of slow movement through the digestive tract Increase in blood pressure Side effects that usually do not require medical attention (report to your care team if they continue or are bothersome): Constipation Muscle pain or cramps Muscle spasms Pain, redness, or irritation at injection site This list may not describe all possible side effects. Call your  doctor for medical advice about side effects. You may report side effects to FDA at 1-800-FDA-1088. Where should I keep my medication? Keep out of the reach of children and pets. Store in a refrigerator or at room temperature between 20 and 25 degrees C (68 and 77 degrees F). Refrigeration (preferred): Store in the refrigerator. Do not freeze. Keep in original container until you are ready to take it. Remove the dose from the carton about 30 minutes before it is time for you to use it. If the dose is not used, it may be stored in original container at room temperature for 7 days. Throw away any unused medication after the expiration date. Room Temperature: This medication may be stored at room temperature for up to 7 days. Keep in original container. Protect from light until time of use. If it is stored at room temperature, throw away any unused medication after 7 days or after it expires, whichever is first. To get rid of medications that are no longer needed or have expired: Take the medication to a medication take-back program. Check with your pharmacy or law enforcement to find a location. If you cannot return the medication, ask your pharmacist or care team how to get rid of this medication safely. NOTE: This sheet is a summary. It may not cover all possible information. If you have questions about this medicine, talk to your doctor, pharmacist, or health care provider.  2023 Elsevier/Gold Standard (2022-01-19 00:00:00) Rizatriptan Tablets What is this medication? RIZATRIPTAN (rye za TRIP tan) treats migraines. It works by blocking pain signals and narrowing blood vessels in the brain. It belongs to a group of medications called triptans. It is not used to prevent migraines. This medicine may be used for other purposes; ask your health care provider or pharmacist if you have questions. COMMON BRAND NAME(S): Maxalt What should I tell my care team before I take this medication? They need to know  if you have any of these conditions: Cigarette smoker Circulation problems in fingers and toes Diabetes Heart disease High blood pressure High cholesterol History of irregular heartbeat History of stroke Kidney disease Liver disease Stomach or intestine problems An unusual or allergic reaction to rizatriptan, other medications, foods, dyes, or preservatives Pregnant or trying to get pregnant Breast-feeding How should I use this medication? Take this medication by mouth with a glass of water. Follow the directions on the prescription label. Do not take it more often than directed. Talk to your care team regarding the use of this medication in children. While this medication may be prescribed for children as young as 6 years for selected conditions, precautions do apply. Overdosage: If you think you have taken too much of this medicine contact a poison control center or emergency room at once. NOTE: This medicine is only for you. Do not share this medicine with others.  What if I miss a dose? This does not apply. This medication is not for regular use. What may interact with this medication? Do not take this medication with any of the following: Certain medications for migraine headache like almotriptan, eletriptan, frovatriptan, naratriptan, rizatriptan, sumatriptan, zolmitriptan Ergot alkaloids like dihydroergotamine, ergonovine, ergotamine, methylergonovine MAOIs like Carbex, Eldepryl, Marplan, Nardil, and Parnate This medication may also interact with the following: Certain medications for depression, anxiety, or psychotic disorders Propranolol This list may not describe all possible interactions. Give your health care provider a list of all the medicines, herbs, non-prescription drugs, or dietary supplements you use. Also tell them if you smoke, drink alcohol, or use illegal drugs. Some items may interact with your medicine. What should I watch for while using this medication? Visit  your care team for regular checks on your progress. Tell your care team if your symptoms do not start to get better or if they get worse. You may get drowsy or dizzy. Do not drive, use machinery, or do anything that needs mental alertness until you know how this medication affects you. Do not stand up or sit up quickly, especially if you are an older patient. This reduces the risk of dizzy or fainting spells. Alcohol may interfere with the effect of this medication. Your mouth may get dry. Chewing sugarless gum or sucking hard candy and drinking plenty of water may help. Contact your care team if the problem does not go away or is severe. If you take migraine medications for 10 or more days a month, your migraines may get worse. Keep a diary of headache days and medication use. Contact your care team if your migraine attacks occur more frequently. What side effects may I notice from receiving this medication? Side effects that you should report to your care team as soon as possible: Allergic reactions--skin rash, itching, hives, swelling of the face, lips, tongue, or throat Burning, pain, tingling, or color changes in the legs or feet Heart attack--pain or tightness in the chest, shoulders, arms, or jaw, nausea, shortness of breath, cold or clammy skin, feeling faint or lightheaded Heart rhythm changes--fast or irregular heartbeat, dizziness, feeling faint or lightheaded, chest pain, trouble breathing Increase in blood pressure Irritability, confusion, fast or irregular heartbeat, muscle stiffness, twitching muscles, sweating, high fever, seizure, chills, vomiting, diarrhea, which may be signs of serotonin syndrome Raynaud's--cool, numb, or painful fingers or toes that may change color from pale, to blue, to red Seizures Stroke--sudden numbness or weakness of the face, arm, or leg, trouble speaking, confusion, trouble walking, loss of balance or coordination, dizziness, severe headache, change in  vision Sudden or severe stomach pain, nausea, vomiting, fever, or bloody diarrhea Vision loss Side effects that usually do not require medical attention (report to your care team if they continue or are bothersome): Dizziness General discomfort or fatigue This list may not describe all possible side effects. Call your doctor for medical advice about side effects. You may report side effects to FDA at 1-800-FDA-1088. Where should I keep my medication? Keep out of the reach of children and pets. Store at room temperature between 15 and 30 degrees C (59 and 86 degrees F). Keep container tightly closed. Throw away any unused medication after the expiration date. NOTE: This sheet is a summary. It may not cover all possible information. If you have questions about this medicine, talk to your doctor, pharmacist, or health care provider.  2023 Elsevier/Gold Standard (2021-01-04 00:00:00) Ubrogepant Tablets What is this medication? UBROGEPANT (  ue BROE je pant) treats migraines. It works by blocking a substance in the body that causes migraines. It is not used to prevent migraines. This medicine may be used for other purposes; ask your health care provider or pharmacist if you have questions. COMMON BRAND NAME(S): Bernita Raisin What should I tell my care team before I take this medication? They need to know if you have any of these conditions: Kidney disease Liver disease An unusual or allergic reaction to ubrogepant, other medications, foods, dyes, or preservatives Pregnant or trying to get pregnant Breast-feeding How should I use this medication? Take this medication by mouth with a glass of water. Take it as directed on the prescription label. You can take it with or without food. If it upsets your stomach, take it with food. Keep taking it unless your care team tells you to stop. Talk to your care team about the use of this medication in children. Special care may be needed. Overdosage: If you think  you have taken too much of this medicine contact a poison control center or emergency room at once. NOTE: This medicine is only for you. Do not share this medicine with others. What if I miss a dose? This does not apply. This medication is not for regular use. What may interact with this medication? Do not take this medication with any of the following: Adagrasib Ceritinib Certain antibiotics, such as chloramphenicol, clarithromycin, telithromycin Certain antivirals for HIV, such as atazanavir, cobicistat, darunavir, delavirdine, fosamprenavir, indinavir, ritonavir Certain medications for fungal infections, such as itraconazole, ketoconazole, posaconazole, voriconazole Conivaptan Grapefruit Idelalisib Mifepristone Nefazodone Ribociclib This medication may also interact with the following: Carvedilol Certain medications for seizures, such as phenobarbital, phenytoin Ciprofloxacin Cyclosporine Eltrombopag Fluconazole Fluvoxamine Quinidine Rifampin St. John's wort Verapamil This list may not describe all possible interactions. Give your health care provider a list of all the medicines, herbs, non-prescription drugs, or dietary supplements you use. Also tell them if you smoke, drink alcohol, or use illegal drugs. Some items may interact with your medicine. What should I watch for while using this medication? Visit your care team for regular checks on your progress. Tell your care team if your symptoms do not start to get better or if they get worse. Your mouth may get dry. Chewing sugarless gum or sucking hard candy and drinking plenty of water may help. Contact your care team if the problem does not go away or is severe. What side effects may I notice from receiving this medication? Side effects that you should report to your care team as soon as possible: Allergic reactions--skin rash, itching, hives, swelling of the face, lips, tongue, or throat Side effects that usually do not  require medical attention (report to your care team if they continue or are bothersome): Drowsiness Dry mouth Fatigue Nausea This list may not describe all possible side effects. Call your doctor for medical advice about side effects. You may report side effects to FDA at 1-800-FDA-1088. Where should I keep my medication? Keep out of the reach of children and pets. Store between 15 and 30 degrees C (59 and 86 degrees F). Get rid of any unused medication after the expiration date. To get rid of medications that are no longer needed or have expired: Take the medication to a medication take-back program. Check with your pharmacy or law enforcement to find a location. If you cannot return the medication, check the label or package insert to see if the medication should be thrown out in the  garbage or flushed down the toilet. If you are not sure, ask your care team. If it is safe to put it in the trash, pour the medication out of the container. Mix the medication with cat litter, dirt, coffee grounds, or other unwanted substance. Seal the mixture in a bag or container. Put it in the trash. NOTE: This sheet is a summary. It may not cover all possible information. If you have questions about this medicine, talk to your doctor, pharmacist, or health care provider.  2023 Elsevier/Gold Standard (2022-01-19 00:00:00)  Ondansetron Dissolving Tablets What is this medication? ONDANSETRON (on DAN se tron) prevents nausea and vomiting from chemotherapy, radiation, or surgery. It works by blocking substances in the body that may cause nausea or vomiting. It belongs to a group of medications called antiemetics. This medicine may be used for other purposes; ask your health care provider or pharmacist if you have questions. COMMON BRAND NAME(S): Zofran ODT What should I tell my care team before I take this medication? They need to know if you have any of these conditions: Heart disease History of irregular  heartbeat Liver disease Low levels of magnesium or potassium in the blood An unusual or allergic reaction to ondansetron, granisetron, other medications, foods, dyes, or preservatives Pregnant or trying to get pregnant Breast-feeding How should I use this medication? These tablets are made to dissolve in the mouth. Do not try to push the tablet through the foil backing. With dry hands, peel away the foil backing and gently remove the tablet. Place the tablet in the mouth and allow it to dissolve, then swallow. While you may take these tablets with water, it is not necessary to do so. Talk to your care team regarding the use of this medication in children. Special care may be needed. Overdosage: If you think you have taken too much of this medicine contact a poison control center or emergency room at once. NOTE: This medicine is only for you. Do not share this medicine with others. What if I miss a dose? If you miss a dose, take it as soon as you can. If it is almost time for your next dose, take only that dose. Do not take double or extra doses. What may interact with this medication? Do not take this medication with any of the following: Apomorphine Certain medications for fungal infections like fluconazole, itraconazole, ketoconazole, posaconazole, voriconazole Cisapride Dronedarone Pimozide Thioridazine This medication may also interact with the following: Carbamazepine Certain medications for depression, anxiety, or psychotic disturbances Fentanyl Linezolid MAOIs like Carbex, Eldepryl, Marplan, Nardil, and Parnate Methylene blue (injected into a vein) Other medications that prolong the QT interval (cause an abnormal heart rhythm) like dofetilide, ziprasidone Phenytoin Rifampicin Tramadol This list may not describe all possible interactions. Give your health care provider a list of all the medicines, herbs, non-prescription drugs, or dietary supplements you use. Also tell them if  you smoke, drink alcohol, or use illegal drugs. Some items may interact with your medicine. What should I watch for while using this medication? Check with your care team as soon as you can if you have any sign of an allergic reaction. What side effects may I notice from receiving this medication? Side effects that you should report to your care team as soon as possible: Allergic reactions--skin rash, itching, hives, swelling of the face, lips, tongue, or throat Bowel blockage--stomach cramping, unable to have a bowel movement or pass gas, loss of appetite, vomiting Chest pain (angina)--pain, pressure, or tightness  in the chest, neck, back, or arms Heart rhythm changes--fast or irregular heartbeat, dizziness, feeling faint or lightheaded, chest pain, trouble breathing Irritability, confusion, fast or irregular heartbeat, muscle stiffness, twitching muscles, sweating, high fever, seizure, chills, vomiting, diarrhea, which may be signs of serotonin syndrome Side effects that usually do not require medical attention (report to your care team if they continue or are bothersome): Constipation Diarrhea General discomfort and fatigue Headache This list may not describe all possible side effects. Call your doctor for medical advice about side effects. You may report side effects to FDA at 1-800-FDA-1088. Where should I keep my medication? Keep out of the reach of children and pets. Store between 2 and 30 degrees C (36 and 86 degrees F). Throw away any unused medication after the expiration date. NOTE: This sheet is a summary. It may not cover all possible information. If you have questions about this medicine, talk to your doctor, pharmacist, or health care provider.  2023 Elsevier/Gold Standard (2020-12-30 00:00:00)

## 2022-05-29 DIAGNOSIS — F419 Anxiety disorder, unspecified: Secondary | ICD-10-CM | POA: Diagnosis not present

## 2022-05-29 DIAGNOSIS — Q048 Other specified congenital malformations of brain: Secondary | ICD-10-CM | POA: Diagnosis not present

## 2022-05-29 DIAGNOSIS — F3341 Major depressive disorder, recurrent, in partial remission: Secondary | ICD-10-CM | POA: Diagnosis not present

## 2022-05-29 DIAGNOSIS — G43909 Migraine, unspecified, not intractable, without status migrainosus: Secondary | ICD-10-CM | POA: Diagnosis not present

## 2022-05-29 DIAGNOSIS — K219 Gastro-esophageal reflux disease without esophagitis: Secondary | ICD-10-CM | POA: Diagnosis not present

## 2022-05-29 DIAGNOSIS — L409 Psoriasis, unspecified: Secondary | ICD-10-CM | POA: Diagnosis not present

## 2022-05-29 DIAGNOSIS — N3010 Interstitial cystitis (chronic) without hematuria: Secondary | ICD-10-CM | POA: Diagnosis not present

## 2022-05-29 DIAGNOSIS — F17200 Nicotine dependence, unspecified, uncomplicated: Secondary | ICD-10-CM | POA: Diagnosis not present

## 2022-05-29 DIAGNOSIS — M159 Polyosteoarthritis, unspecified: Secondary | ICD-10-CM | POA: Diagnosis not present

## 2022-08-03 DIAGNOSIS — M159 Polyosteoarthritis, unspecified: Secondary | ICD-10-CM | POA: Diagnosis not present

## 2022-08-03 DIAGNOSIS — L409 Psoriasis, unspecified: Secondary | ICD-10-CM | POA: Diagnosis not present

## 2022-08-03 DIAGNOSIS — R5382 Chronic fatigue, unspecified: Secondary | ICD-10-CM | POA: Diagnosis not present

## 2022-08-03 DIAGNOSIS — H8110 Benign paroxysmal vertigo, unspecified ear: Secondary | ICD-10-CM | POA: Diagnosis not present

## 2022-08-03 DIAGNOSIS — G43909 Migraine, unspecified, not intractable, without status migrainosus: Secondary | ICD-10-CM | POA: Diagnosis not present

## 2022-08-03 DIAGNOSIS — K219 Gastro-esophageal reflux disease without esophagitis: Secondary | ICD-10-CM | POA: Diagnosis not present

## 2022-08-03 DIAGNOSIS — Z6820 Body mass index (BMI) 20.0-20.9, adult: Secondary | ICD-10-CM | POA: Diagnosis not present

## 2022-08-03 DIAGNOSIS — F419 Anxiety disorder, unspecified: Secondary | ICD-10-CM | POA: Diagnosis not present

## 2022-08-03 DIAGNOSIS — F17200 Nicotine dependence, unspecified, uncomplicated: Secondary | ICD-10-CM | POA: Diagnosis not present

## 2022-08-27 DIAGNOSIS — F3341 Major depressive disorder, recurrent, in partial remission: Secondary | ICD-10-CM | POA: Diagnosis not present

## 2022-08-27 DIAGNOSIS — L409 Psoriasis, unspecified: Secondary | ICD-10-CM | POA: Diagnosis not present

## 2022-08-27 DIAGNOSIS — M159 Polyosteoarthritis, unspecified: Secondary | ICD-10-CM | POA: Diagnosis not present

## 2022-08-27 DIAGNOSIS — M25552 Pain in left hip: Secondary | ICD-10-CM | POA: Diagnosis not present

## 2022-08-27 DIAGNOSIS — F419 Anxiety disorder, unspecified: Secondary | ICD-10-CM | POA: Diagnosis not present

## 2022-08-27 DIAGNOSIS — F17200 Nicotine dependence, unspecified, uncomplicated: Secondary | ICD-10-CM | POA: Diagnosis not present

## 2022-08-27 DIAGNOSIS — G43909 Migraine, unspecified, not intractable, without status migrainosus: Secondary | ICD-10-CM | POA: Diagnosis not present

## 2022-08-27 DIAGNOSIS — K219 Gastro-esophageal reflux disease without esophagitis: Secondary | ICD-10-CM | POA: Diagnosis not present

## 2022-08-27 DIAGNOSIS — H8110 Benign paroxysmal vertigo, unspecified ear: Secondary | ICD-10-CM | POA: Diagnosis not present

## 2022-09-04 DIAGNOSIS — F419 Anxiety disorder, unspecified: Secondary | ICD-10-CM | POA: Diagnosis not present

## 2022-09-04 DIAGNOSIS — G43909 Migraine, unspecified, not intractable, without status migrainosus: Secondary | ICD-10-CM | POA: Diagnosis not present

## 2022-09-04 DIAGNOSIS — M159 Polyosteoarthritis, unspecified: Secondary | ICD-10-CM | POA: Diagnosis not present

## 2022-09-04 DIAGNOSIS — M65811 Other synovitis and tenosynovitis, right shoulder: Secondary | ICD-10-CM | POA: Diagnosis not present

## 2022-09-04 DIAGNOSIS — F17200 Nicotine dependence, unspecified, uncomplicated: Secondary | ICD-10-CM | POA: Diagnosis not present

## 2022-09-04 DIAGNOSIS — L409 Psoriasis, unspecified: Secondary | ICD-10-CM | POA: Diagnosis not present

## 2022-09-04 DIAGNOSIS — F3341 Major depressive disorder, recurrent, in partial remission: Secondary | ICD-10-CM | POA: Diagnosis not present

## 2022-09-04 DIAGNOSIS — H8110 Benign paroxysmal vertigo, unspecified ear: Secondary | ICD-10-CM | POA: Diagnosis not present

## 2022-09-04 DIAGNOSIS — K219 Gastro-esophageal reflux disease without esophagitis: Secondary | ICD-10-CM | POA: Diagnosis not present

## 2022-09-11 DIAGNOSIS — F3341 Major depressive disorder, recurrent, in partial remission: Secondary | ICD-10-CM | POA: Diagnosis not present

## 2022-09-11 DIAGNOSIS — H8110 Benign paroxysmal vertigo, unspecified ear: Secondary | ICD-10-CM | POA: Diagnosis not present

## 2022-09-11 DIAGNOSIS — Z6820 Body mass index (BMI) 20.0-20.9, adult: Secondary | ICD-10-CM | POA: Diagnosis not present

## 2022-09-11 DIAGNOSIS — K219 Gastro-esophageal reflux disease without esophagitis: Secondary | ICD-10-CM | POA: Diagnosis not present

## 2022-09-11 DIAGNOSIS — F419 Anxiety disorder, unspecified: Secondary | ICD-10-CM | POA: Diagnosis not present

## 2022-09-11 DIAGNOSIS — G43909 Migraine, unspecified, not intractable, without status migrainosus: Secondary | ICD-10-CM | POA: Diagnosis not present

## 2022-09-11 DIAGNOSIS — F17200 Nicotine dependence, unspecified, uncomplicated: Secondary | ICD-10-CM | POA: Diagnosis not present

## 2022-09-11 DIAGNOSIS — L409 Psoriasis, unspecified: Secondary | ICD-10-CM | POA: Diagnosis not present

## 2022-09-11 DIAGNOSIS — M159 Polyosteoarthritis, unspecified: Secondary | ICD-10-CM | POA: Diagnosis not present

## 2022-09-20 NOTE — Progress Notes (Deleted)
   PATIENT: Brittany Hopkins DOB: [birth year]  REASON FOR VISIT: follow up HISTORY FROM: patient  Virtual Visit via Telephone Note  I connected with Brittany Hopkins on 09/20/22 at  8:00 AM EDT by telephone and verified that I am speaking with the correct person using two identifiers.   I discussed the limitations, risks, security and privacy concerns of performing an evaluation and management service by telephone and the availability of in person appointments. I also discussed with the patient that there may be a patient responsible charge related to this service. The patient expressed understanding and agreed to proceed.   History of Present Illness:  09/20/22 ALL ([place]): Brittany Hopkins is a 46 y.o. female here today for follow up for migraines. She was last seen by Dr Jaynee Eagles 05/2022 and started on Amovig. Rizatriptan, ondansetron and Roselyn Meier advised for abortive therapy.   Tried and failed: Amovig (on now), Botox (caused rash), topiramate, amitriptyline, zonisamide, mirtazapine, cyclobenzaprine, sumatriptan, rizatriptan, meclizine, ondansetron  History (copied from Dr Cathren Laine previous note)  05/16/2022: Brittany Hopkins is a 46 y.o. female here as a referral from Dr. Truman Hayward for migraines.  Past medical history benign positional vertigo, vestibular migraines,  cerebellar tonsillar ectopia, migraines, interstitial cystitis, vertigo. Started >20 years ago after having children. We have seen patient in the past for migraines. We started her on Aimovig. She had 2 botox appointments last 08/2018 and then no-showed and never came back. She says the botox was helping but she had a rash and turnedout to be psoriasis and she still has the rash. Her migraines are worsening, she stopped ibuprofen and tylenol, she has daily headaches, 8 migraine days a month that moderate to sever in intensity, has to go into a dark room, smell triggers, photophobia, nausea, vomiting every time, ongoing for . 1 year,  no aura, no medication overuse, pulsating/pounding/throbbing and can last 24 hours. Hurts to move. We discussed options, She tried Aimovig in the past, she wants to try the aimovig again. No other focal neurologic deficits, associated symptoms, inciting events or modifiable factors.   Observations/Objective:  Generalized: Well developed, in no acute distress  Mentation: Alert oriented to time, place, history taking. Follows all commands speech and language fluent   Assessment and Plan:  46 y.o. year old female  has a past medical history of Heart attack ([place]) and Migraine. here with  No diagnosis found.  No orders of the defined types were placed in this encounter.   No orders of the defined types were placed in this encounter.    Follow Up Instructions:  I discussed the assessment and treatment plan with the patient. The patient was provided an opportunity to ask questions and all were answered. The patient agreed with the plan and demonstrated an understanding of the instructions.   The patient was advised to call back or seek an in-person evaluation if the symptoms worsen or if the condition fails to improve as anticipated.  I provided *** minutes of non-face-to-face time during this encounter. Patient located at their place of residence during [place] visit. Provider is in the office.    Debbora Presto, NP

## 2022-09-25 ENCOUNTER — Telehealth: Payer: Medicare HMO | Admitting: Family Medicine

## 2022-09-25 DIAGNOSIS — G43711 Chronic migraine without aura, intractable, with status migrainosus: Secondary | ICD-10-CM

## 2023-01-08 DIAGNOSIS — M159 Polyosteoarthritis, unspecified: Secondary | ICD-10-CM | POA: Diagnosis not present

## 2023-01-08 DIAGNOSIS — F3341 Major depressive disorder, recurrent, in partial remission: Secondary | ICD-10-CM | POA: Diagnosis not present

## 2023-01-08 DIAGNOSIS — F419 Anxiety disorder, unspecified: Secondary | ICD-10-CM | POA: Diagnosis not present

## 2023-01-08 DIAGNOSIS — F17200 Nicotine dependence, unspecified, uncomplicated: Secondary | ICD-10-CM | POA: Diagnosis not present

## 2023-01-08 DIAGNOSIS — G43909 Migraine, unspecified, not intractable, without status migrainosus: Secondary | ICD-10-CM | POA: Diagnosis not present

## 2023-01-08 DIAGNOSIS — H8110 Benign paroxysmal vertigo, unspecified ear: Secondary | ICD-10-CM | POA: Diagnosis not present

## 2023-01-08 DIAGNOSIS — L409 Psoriasis, unspecified: Secondary | ICD-10-CM | POA: Diagnosis not present

## 2023-01-08 DIAGNOSIS — M65811 Other synovitis and tenosynovitis, right shoulder: Secondary | ICD-10-CM | POA: Diagnosis not present

## 2023-01-08 DIAGNOSIS — K219 Gastro-esophageal reflux disease without esophagitis: Secondary | ICD-10-CM | POA: Diagnosis not present

## 2023-01-23 DIAGNOSIS — F3341 Major depressive disorder, recurrent, in partial remission: Secondary | ICD-10-CM | POA: Diagnosis not present

## 2023-01-23 DIAGNOSIS — F17200 Nicotine dependence, unspecified, uncomplicated: Secondary | ICD-10-CM | POA: Diagnosis not present

## 2023-01-23 DIAGNOSIS — M25552 Pain in left hip: Secondary | ICD-10-CM | POA: Diagnosis not present

## 2023-01-23 DIAGNOSIS — K219 Gastro-esophageal reflux disease without esophagitis: Secondary | ICD-10-CM | POA: Diagnosis not present

## 2023-01-23 DIAGNOSIS — M159 Polyosteoarthritis, unspecified: Secondary | ICD-10-CM | POA: Diagnosis not present

## 2023-01-23 DIAGNOSIS — F419 Anxiety disorder, unspecified: Secondary | ICD-10-CM | POA: Diagnosis not present

## 2023-01-23 DIAGNOSIS — L409 Psoriasis, unspecified: Secondary | ICD-10-CM | POA: Diagnosis not present

## 2023-01-23 DIAGNOSIS — G43909 Migraine, unspecified, not intractable, without status migrainosus: Secondary | ICD-10-CM | POA: Diagnosis not present

## 2023-01-23 DIAGNOSIS — H8110 Benign paroxysmal vertigo, unspecified ear: Secondary | ICD-10-CM | POA: Diagnosis not present

## 2023-01-30 DIAGNOSIS — K219 Gastro-esophageal reflux disease without esophagitis: Secondary | ICD-10-CM | POA: Diagnosis not present

## 2023-01-30 DIAGNOSIS — G43909 Migraine, unspecified, not intractable, without status migrainosus: Secondary | ICD-10-CM | POA: Diagnosis not present

## 2023-01-30 DIAGNOSIS — M159 Polyosteoarthritis, unspecified: Secondary | ICD-10-CM | POA: Diagnosis not present

## 2023-01-30 DIAGNOSIS — F3341 Major depressive disorder, recurrent, in partial remission: Secondary | ICD-10-CM | POA: Diagnosis not present

## 2023-01-30 DIAGNOSIS — H8110 Benign paroxysmal vertigo, unspecified ear: Secondary | ICD-10-CM | POA: Diagnosis not present

## 2023-01-30 DIAGNOSIS — F17200 Nicotine dependence, unspecified, uncomplicated: Secondary | ICD-10-CM | POA: Diagnosis not present

## 2023-01-30 DIAGNOSIS — F419 Anxiety disorder, unspecified: Secondary | ICD-10-CM | POA: Diagnosis not present

## 2023-01-30 DIAGNOSIS — G609 Hereditary and idiopathic neuropathy, unspecified: Secondary | ICD-10-CM | POA: Diagnosis not present

## 2023-01-30 DIAGNOSIS — L409 Psoriasis, unspecified: Secondary | ICD-10-CM | POA: Diagnosis not present
